# Patient Record
Sex: Male | Born: 1975 | Race: White | Hispanic: No | Marital: Single | Smoking: Current every day smoker
Health system: Southern US, Community
[De-identification: ages and names within clinical notes are randomized; demographics above are authoritative.]

## PROBLEM LIST (undated history)

## (undated) DIAGNOSIS — F419 Anxiety disorder, unspecified: Secondary | ICD-10-CM

## (undated) DIAGNOSIS — N200 Calculus of kidney: Secondary | ICD-10-CM

## (undated) DIAGNOSIS — Z87442 Personal history of urinary calculi: Secondary | ICD-10-CM

## (undated) DIAGNOSIS — N390 Urinary tract infection, site not specified: Secondary | ICD-10-CM

## (undated) HISTORY — PX: NO PAST SURGERIES: SHX2092

## (undated) HISTORY — PX: WISDOM TOOTH EXTRACTION: SHX21

---

## 1999-06-16 ENCOUNTER — Encounter: Payer: Self-pay | Admitting: *Deleted

## 1999-06-16 ENCOUNTER — Ambulatory Visit (HOSPITAL_COMMUNITY): Admission: RE | Admit: 1999-06-16 | Discharge: 1999-06-16 | Payer: Self-pay | Admitting: Pediatrics

## 2010-09-09 ENCOUNTER — Emergency Department (HOSPITAL_COMMUNITY): Admission: EM | Admit: 2010-09-09 | Discharge: 2010-09-09 | Payer: Self-pay | Admitting: Emergency Medicine

## 2010-10-06 ENCOUNTER — Encounter
Admission: RE | Admit: 2010-10-06 | Discharge: 2010-10-15 | Payer: Self-pay | Source: Home / Self Care | Attending: *Deleted | Admitting: *Deleted

## 2010-10-17 ENCOUNTER — Ambulatory Visit (HOSPITAL_COMMUNITY): Admission: RE | Admit: 2010-10-17 | Discharge: 2010-10-17 | Payer: Self-pay | Admitting: *Deleted

## 2012-10-21 ENCOUNTER — Emergency Department (HOSPITAL_COMMUNITY): Payer: Self-pay

## 2012-10-21 ENCOUNTER — Encounter (HOSPITAL_COMMUNITY): Payer: Self-pay | Admitting: Emergency Medicine

## 2012-10-21 ENCOUNTER — Emergency Department (HOSPITAL_COMMUNITY)
Admission: EM | Admit: 2012-10-21 | Discharge: 2012-10-21 | Disposition: A | Discharge status: Home / Self Care | Payer: Self-pay | Attending: Emergency Medicine | Admitting: Emergency Medicine

## 2012-10-21 DIAGNOSIS — F17200 Nicotine dependence, unspecified, uncomplicated: Secondary | ICD-10-CM | POA: Insufficient documentation

## 2012-10-21 DIAGNOSIS — Y9389 Activity, other specified: Secondary | ICD-10-CM | POA: Insufficient documentation

## 2012-10-21 DIAGNOSIS — IMO0002 Reserved for concepts with insufficient information to code with codable children: Secondary | ICD-10-CM | POA: Insufficient documentation

## 2012-10-21 DIAGNOSIS — F411 Generalized anxiety disorder: Secondary | ICD-10-CM | POA: Insufficient documentation

## 2012-10-21 DIAGNOSIS — Z23 Encounter for immunization: Secondary | ICD-10-CM | POA: Insufficient documentation

## 2012-10-21 DIAGNOSIS — S8390XA Sprain of unspecified site of unspecified knee, initial encounter: Secondary | ICD-10-CM

## 2012-10-21 HISTORY — DX: Anxiety disorder, unspecified: F41.9

## 2012-10-21 MED ORDER — OXYCODONE-ACETAMINOPHEN 5-325 MG PO TABS: ORAL_TABLET | ORAL | Status: DC

## 2012-10-21 MED ORDER — OXYCODONE-ACETAMINOPHEN 5-325 MG PO TABS
ORAL_TABLET | ORAL | Status: AC
  Filled 2012-10-21: qty 1

## 2012-10-21 MED ORDER — TETANUS-DIPHTH-ACELL PERTUSSIS 5-2.5-18.5 LF-MCG/0.5 IM SUSP
0.5000 mL | Freq: Once | INTRAMUSCULAR | Status: AC
  Administered 2012-10-21: 0.5 mL via INTRAMUSCULAR
  Filled 2012-10-21: qty 0.5

## 2012-10-21 MED ORDER — OXYCODONE-ACETAMINOPHEN 5-325 MG PO TABS
2.0000 | ORAL_TABLET | Freq: Once | ORAL | Status: AC
  Administered 2012-10-21: 2 via ORAL
  Filled 2012-10-21: qty 2
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
## 2012-10-21 NOTE — ED Provider Notes (Signed)
Medical screening examination/treatment/procedure(s) were performed by non-physician practitioner and as supervising physician I was immediately available for consultation/collaboration.  Derwood Kaplan, MD 10/21/12 2336

## 2012-10-21 NOTE — ED Notes (Signed)
Patient transported to X-ray 

## 2012-10-21 NOTE — ED Notes (Signed)
Patient given discharge instructions, information, prescriptions, and diet order. Patient states that they adequately understand discharge information given and to return to ED if symptoms return or worsen.     
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
## 2014-02-08 ENCOUNTER — Emergency Department (HOSPITAL_COMMUNITY): Payer: No Typology Code available for payment source

## 2014-02-08 ENCOUNTER — Encounter (HOSPITAL_COMMUNITY): Payer: Self-pay | Admitting: Emergency Medicine

## 2014-02-08 DIAGNOSIS — Z7982 Long term (current) use of aspirin: Secondary | ICD-10-CM | POA: Insufficient documentation

## 2014-02-08 DIAGNOSIS — R05 Cough: Secondary | ICD-10-CM | POA: Insufficient documentation

## 2014-02-08 DIAGNOSIS — F411 Generalized anxiety disorder: Secondary | ICD-10-CM | POA: Insufficient documentation

## 2014-02-08 DIAGNOSIS — F17200 Nicotine dependence, unspecified, uncomplicated: Secondary | ICD-10-CM | POA: Insufficient documentation

## 2014-02-08 DIAGNOSIS — R059 Cough, unspecified: Secondary | ICD-10-CM | POA: Insufficient documentation

## 2014-02-08 DIAGNOSIS — Z79899 Other long term (current) drug therapy: Secondary | ICD-10-CM | POA: Insufficient documentation

## 2014-02-08 DIAGNOSIS — R071 Chest pain on breathing: Secondary | ICD-10-CM | POA: Insufficient documentation

## 2014-02-08 DIAGNOSIS — R0602 Shortness of breath: Secondary | ICD-10-CM | POA: Insufficient documentation

## 2014-02-08 LAB — BASIC METABOLIC PANEL
BUN: 12 mg/dL (ref 6–23)
CALCIUM: 9.8 mg/dL (ref 8.4–10.5)
CHLORIDE: 103 meq/L (ref 96–112)
CO2: 26 mEq/L (ref 19–32)
CREATININE: 0.99 mg/dL (ref 0.50–1.35)
Glucose, Bld: 93 mg/dL (ref 70–99)
Potassium: 4.1 mEq/L (ref 3.7–5.3)
Sodium: 145 mEq/L (ref 137–147)

## 2014-02-08 LAB — CBC
HCT: 48.7 % (ref 39.0–52.0)
Hemoglobin: 17.8 g/dL — ABNORMAL HIGH (ref 13.0–17.0)
MCH: 32.2 pg (ref 26.0–34.0)
MCHC: 36.6 g/dL — AB (ref 30.0–36.0)
MCV: 88.1 fL (ref 78.0–100.0)
PLATELETS: 289 10*3/uL (ref 150–400)
RBC: 5.53 MIL/uL (ref 4.22–5.81)
RDW: 14.3 % (ref 11.5–15.5)
WBC: 9.8 10*3/uL (ref 4.0–10.5)

## 2014-02-08 LAB — POCT I-STAT TROPONIN I: TROPONIN I, POC: 0 ng/mL (ref 0.00–0.08)

## 2014-02-08 NOTE — ED Notes (Signed)
Pt informed to let RN in front know if chest pain worsened or he had increased difficulty breathing; process explained that he would get blood work and xray as he waited.

## 2014-02-08 NOTE — ED Notes (Signed)
Pt reports general feeling of unwellness today; chest pain starting at 5:00 pm that was unrelieved with aspirin; radiated to left arm and shoulder. Pain is burning and stabbing "feels like someone sticking hot needle in chest". No cardicac hx. Feels SOB even with no exertion.

## 2014-02-09 ENCOUNTER — Emergency Department (HOSPITAL_COMMUNITY)
Admission: EM | Admit: 2014-02-09 | Discharge: 2014-02-09 | Disposition: A | Discharge status: Home / Self Care | Payer: No Typology Code available for payment source | Attending: Emergency Medicine | Admitting: Emergency Medicine

## 2014-02-09 DIAGNOSIS — R0789 Other chest pain: Secondary | ICD-10-CM

## 2014-02-09 MED ORDER — METAXALONE 800 MG PO TABS: 800.0000 mg | ORAL_TABLET | Freq: Three times a day (TID) | ORAL | Status: DC

## 2014-02-09 MED ORDER — ALBUTEROL SULFATE (2.5 MG/3ML) 0.083% IN NEBU
2.5000 mg | INHALATION_SOLUTION | Freq: Once | RESPIRATORY_TRACT | Status: AC
  Administered 2014-02-09: 2.5 mg via RESPIRATORY_TRACT
  Filled 2014-02-09: qty 3

## 2014-02-09 MED ORDER — KETOROLAC TROMETHAMINE 30 MG/ML IJ SOLN
30.0000 mg | Freq: Once | INTRAMUSCULAR | Status: AC
  Administered 2014-02-09: 30 mg via INTRAMUSCULAR
  Filled 2014-02-09: qty 1

## 2014-02-09 MED ORDER — METAXALONE 800 MG PO TABS
800.0000 mg | ORAL_TABLET | Freq: Once | ORAL | Status: AC
  Administered 2014-02-09: 800 mg via ORAL
  Filled 2014-02-09: qty 1

## 2014-02-09 MED ORDER — IBUPROFEN 600 MG PO TABS: 600.0000 mg | ORAL_TABLET | Freq: Four times a day (QID) | ORAL | Status: DC | PRN

## 2014-02-09 NOTE — ED Provider Notes (Signed)
Medical screening examination/treatment/procedure(s) were performed by non-physician practitioner and as supervising physician I was immediately available for consultation/collaboration.    [name], MD 02/09/14 2323 

## 2014-02-09 NOTE — Progress Notes (Signed)
                  Dear _________[name]_______________:  Bonita QuinYou have been approved to have your discharge prescriptions filled through our [facility] ([facility]ication [facility]) program. This program allows for a one-time (no refills) 34-day supply of selected medications for a low copay amount.  The copay is $3.00 per prescription. For instance, if you have one prescription, you will pay $3.00; for two prescriptions, you pay $6.00; for three prescriptions, you pay $9.00; and so on.  Only certain pharmacies are participating in this program with [facility]. You will need to select one of the pharmacies from the attached list and take your prescriptions, this letter, and your photo ID to one of the participating pharmacies.   We are excited that you are able to use the [facility] program to get your medications. These prescriptions must be filled within 7 days of hospital discharge or they will no longer be valid for the [facility] program. Should you have any problems with your prescriptions please contact your case management team member at [phone].  Thank you,   [facility]   Participating [facility] Pharmacies  [facility]   [facility] [address] [place], [place]   [facility] [address] [place], [place]   [facility] [address] [place], [place]   [facility]   [address], [place], [place]   [id number], [place], [place]   [address], [place], [place]   [id number], [place], [place]   [id number], [place], [place]   [id number] [address], [place], [place]   [address], [place], [place]   [address], [place], [place]   [address], [place], [place]   [id number] [place], [place], [place]  [facility]   [address], [place], [place]   [id number] [place], [place]   [id number], [place], [place]   [id number], [place], [place]   [address], [place], [place]   [id number] [place] [address], [place], [place] [facility]   [address], [place], [place]   [address], [place], [place]   [id number], [place], [place]   [address], [place], [place]   [address], [place], [place]   [address], [place], [place]   [address], [place], [place]   [address], [place], [place]   [id number] [address], [place], [place]   [address], [place], [place]   [address], [place], [place]   [address], [place], [place]   [address], [place], [place]   [id number] [place], [place], [place]  Independent Pharmacies   [facility] [address], [address] [place], [place]   [facility] [address] [place], [place]   [facility] [address] [place], [place]   For continued medication needs, please contact the [facility] at  [phone].

## 2014-02-09 NOTE — ED Provider Notes (Signed)
CSN: [id number]     Arrival date & time 02/08/14  2140 History   First MD Initiated Contact with Patient 02/09/14 0034     Chief Complaint  Patient presents with  . Chest Pain     (Consider location/radiation/quality/duration/timing/severity/associated sxs/prior Treatment) HPI Comments: Patient states, that for the past several, weeks.  He's had this uncomfortable feeling in his chest, sometimes, as a sharp, stabbing feeling, sometimes, a burning sensation, sometimes, as a sticking sensation, associated with feeling short of breath.  He, states several weeks, ago, was intermittent, every couple days, lasting 10-15 minutes.  The episodes have become more frequent and tonight.  He became concerned. He has no personal history of cardiac disease.  Has a remote history of, asthma as a child, and 19.  Has not needed to use any albuterol treatments in quite some time.  Denies any trauma, heavy lifting, recent URI symptoms, but he does have a chronic cough, as he is a smoker, one pack a day, for many years. 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 He has not worked in several weeks.  Today he played disc golf, which made his chest uncomfortable, so he stopped. He does, state that several years ago.  He did have some anxiety issues that felt similar to his current discomfort  Patient is a 38 y.o. male presenting with chest pain. The history is provided by the patient.  Chest Pain Pain location:  L chest Pain quality: aching   Pain radiates to:  Does not radiate Pain radiates to the back: no   Pain severity:  Mild Onset quality:  Unable to specify Duration:  3 weeks Timing:  Intermittent Progression:  Worsening Chronicity:  New Context: breathing and lifting   Relieved by:  None tried Worsened by:  Certain positions, coughing, exertion and movement Ineffective treatments:  None tried Associated symptoms: cough and shortness of breath   Associated  symptoms: no abdominal pain, no back pain, no dizziness, no dysphagia, no fever, no headache, no lower extremity edema, no nausea and no palpitations   Cough:    Cough characteristics:  Non-productive   Onset quality:  Unable to specify   Timing:  Intermittent   Progression:  Unchanged   Chronicity:  Chronic Risk factors: male sex and smoking   Risk factors: no hypertension and not obese     Past Medical History  Diagnosis Date  . Anxiety   . Anxiety    No past surgical history on file. No family history on file. History  Substance Use Topics  . Smoking status: Current Every Day Smoker -- 1.00 packs/day    Types: Cigarettes  . Smokeless tobacco: Not on file  . Alcohol Use: Not on file    Review of Systems  Constitutional: Negative for fever.  HENT: Negative for rhinorrhea and trouble swallowing.   Respiratory: Positive for cough and shortness of breath.   Cardiovascular: Positive for chest pain. Negative for palpitations.  Gastrointestinal: Negative for nausea and abdominal pain.  Musculoskeletal: Negative for back pain.  Neurological: Negative for dizziness and headaches.  All other systems reviewed and are negative.      Allergies  Review of patient's allergies indicates no known allergies.  Home Medications   Current Outpatient Rx  Name  Route  Sig  Dispense  Refill  . aspirin EC 81 MG tablet   Oral   Take 162 mg by mouth daily as needed (pain).         .Marland Kitchen  ibuprofen (ADVIL,MOTRIN) 200 MG tablet   Oral   Take 200 mg by mouth daily as needed for headache. Pain         . ibuprofen (ADVIL,MOTRIN) 600 MG tablet   Oral   Take 1 tablet (600 mg total) by mouth every 6 (six) hours as needed.   30 tablet   0   . metaxalone (SKELAXIN) 800 MG tablet   Oral   Take 1 tablet (800 mg total) by mouth 3 (three) times daily.   60 tablet   0    BP 98/73  Pulse 61  Temp(Src) 97.6 F (36.4 C) (Oral)  Resp 15  SpO2 96% Physical Exam  Nursing note and vitals  reviewed. Constitutional: He is oriented to person, place, and time. He appears well-developed and well-nourished.  HENT:  Head: Normocephalic.  Eyes: Pupils are equal, round, and reactive to light.  Neck: Normal range of motion.  Cardiovascular: Normal rate and regular rhythm.   Pulmonary/Chest: Effort normal and breath sounds normal. No respiratory distress. He has no wheezes. He exhibits tenderness.  Abdominal: Soft.  Musculoskeletal: Normal range of motion. He exhibits no edema and no tenderness.  Neurological: He is alert and oriented to person, place, and time.  Skin: Skin is warm.    ED Course  Procedures (including critical care time) Labs Review Labs Reviewed  CBC - Abnormal; Notable for the following:    Hemoglobin 17.8 (*)    MCHC 36.6 (*)    All other components within normal limits  BASIC METABOLIC PANEL  POCT I-STAT TROPONIN I   Imaging Review Dg Chest 2 View  02/08/2014   CLINICAL DATA:  Left chest pain, cough  EXAM: CHEST  2 VIEW  COMPARISON:  None.  FINDINGS: The heart size and mediastinal contours are within normal limits. There is no focal infiltrate, pulmonary edema, or pleural effusion. Tiny calcified granulomas are identified within the left upper lobe. The visualized skeletal structures are unremarkable.  IMPRESSION: No active cardiopulmonary disease.   Electronically Signed   By: Sherian Rein M.D.   On: 02/08/2014 22:12    EKG Interpretation    Date/Time:  Thursday February 08 2014 21:42:24 EST Ventricular Rate:  91 PR Interval:  128 QRS Duration: 96 QT Interval:  372 QTC Calculation: 457 R Axis:   78 Text Interpretation:  Normal sinus rhythm Nonspecific T wave abnormality Abnormal ECG No old tracing to compare Confirmed by OTTER  MD, OLGA ([id number]) on 02/08/2014 11:46:24 PM            MDM   Final diagnoses:  Anterior chest wall pain         Arman Filter, NP 02/09/14 1958

## 2014-02-09 NOTE — Discharge Instructions (Signed)
Chest Wall Pain Chest wall pain is pain in or around the bones and muscles of your chest. It may take up to 6 weeks to get better. It may take longer if you must stay physically active in your work and activities.  CAUSES  Chest wall pain may happen on its own. However, it may be caused by:  A viral illness like the flu.  Injury.  Coughing.  Exercise.  Arthritis.  Fibromyalgia.  Shingles. HOME CARE INSTRUCTIONS   Avoid overtiring physical activity. Try not to strain or perform activities that cause pain. This includes any activities using your chest or your abdominal and side muscles, especially if heavy weights are used.  Put ice on the sore area.  Put ice in a plastic bag.  Place a towel between your skin and the bag.  Leave the ice on for 15-20 minutes per hour while awake for the first 2 days.  Only take over-the-counter or prescription medicines for pain, discomfort, or fever as directed by your caregiver. SEEK IMMEDIATE MEDICAL CARE IF:   Your pain increases, or you are very uncomfortable.  You have a fever.  Your chest pain becomes worse.  You have new, unexplained symptoms.  You have nausea or vomiting.  You feel sweaty or lightheaded.  You have a cough with phlegm (sputum), or you cough up blood. MAKE SURE YOU:   Understand these instructions.  Will watch your condition.  Will get help right away if you are not doing well or get worse. Document Released: 12/14/2005 Document Revised: 03/07/2012 Document Reviewed: 08/10/2011 [facility] Patient Information 2014 [place], [place].  Emergency Department Resource Guide 1) Find a Doctor and Pay Out of Pocket Although you won't have to find out who is covered by your insurance plan, it is a good idea to ask around and get recommendations. You will then need to call the office and see if the doctor you have chosen will accept you as a new patient and what types of options they offer for patients who are self-pay.  Some doctors offer discounts or will set up payment plans for their patients who do not have insurance, but you will need to ask so you aren't surprised when you get to your appointment.  2) Contact Your Local Health Department Not all health departments have doctors that can see patients for sick visits, but many do, so it is worth a call to see if yours does. If you don't know where your local health department is, you can check in your phone book. The CDC also has a tool to help you locate your state's health department, and many state websites also have listings of all of their local health departments.  3) Find a Walk-in Clinic If your illness is not likely to be very severe or complicated, you may want to try a walk in clinic. These are popping up all over the country in pharmacies, drugstores, and shopping centers. They're usually staffed by nurse practitioners or physician assistants that have been trained to treat common illnesses and complaints. They're usually fairly quick and inexpensive. However, if you have serious medical issues or chronic medical problems, these are probably not your best option.  No Primary Care Doctor: - Call Health Connect at  [phone] - they can help you locate a primary care doctor that  accepts your insurance, provides certain services, etc. - Physician Referral Service- [phone]  Chronic Pain Problems: Organization         Address  Phone  Notes  °[facility]  ([phone]) [phone] Patients need to be referred by their primary care doctor.  ° °Medication Assistance: °Organization         Address  Phone   Notes  °[place] [facility] [address] °[place], [place] [address] (336) 641-8030 --Must be a resident of [place] °-- Must have NO insurance coverage whatsoever (no Medicaid/ Medicare, etc.) °-- The pt. MUST have a primary care doctor that directs their care regularly and follows them in the  community °  °[facility]ist  (866) 331-1348   °United Way  (888) 892-1162   ° °Agencies that provide inexpensive medical care: °Organization         Address  Phone   Notes  °[facility]  (336) [phone]   °[facility]    (336) 832-7272   °[facility] [address] °[place], [place] [address] (336) 832-4777   °[facility] [address], °[place] (336) 271-4999   °[facility]    (336) 373-0678   °[facility]    (336) 272-1050   °[facility] ° [address], [place] Phone:  (336) 832-4444, Fax:  (336) 832-4440 Hours of Operation:  9 am - 6 pm, M-F.  Also accepts Medicaid/Medicare and self-pay.  °[facility] ° [address], [place] Phone: (336) [phone], Fax: (336) [phone]. Hours of Operation:  8:30 am - 5:30 pm, M-F.  Also accepts Medicaid and self-pay.  °[facility] [place] [address], [place] Phone: (336) [phone]   °[facility] [address], [place], [place] (336)723-1848, Ext. 123 Mondays & Thursdays: 7-9 AM.  First 15 patients are seen on a first come, first serve basis. °  ° °Medicaid-accepting [place] Providers: ° °Organization         Address  Phone   Notes  °[facility] [address], [place] ([phone]) [phone] Also accepts self-pay patients.  °[facility] [address], [place] ° (336) 856-9996   °[facility] [address], [place] (336) 288-8857   °[facility] [address] [place] [address], [place] (336) 299-7000   °Veita Bland [address], [place]  ° (336) [phone] Only accepts [place] Access Medicaid patients after they have their name applied to their card.  ° °Self-Pay (no insurance) in [place]: ° °Organization         Address  Phone   Notes  °[facility], [facility] [address], [place] (336) [phone]   °[facility] [address], [place] (336) 832-4400   °[facility] [place] ° [address], [place] (336) 992-4800   °[facility]/Dr. Osei-Bonsu ° [address] [place] [address], [place] or [address], [place] (336) [phone] Phone number for both [place] and [place] locations is the same.  °[facility] [address], [place] (336) [phone]   °[facility] [address] [place] [address],  or [address] (336) 852-7530 °(336) 878-2260   °[facility] [address],  (336) 350-1642, phone; (336) [phone], fax Sees patients 1st and 3rd Saturday of every month.  Must not qualify for public or private insurance (  i.e. Medicaid, Medicare, [place] Health Choice, Veterans' Benefits)  Household income should be no more than 200% of the poverty level The clinic cannot treat you if you are pregnant or think you are pregnant  Sexually transmitted diseases are not treated at the clinic.    Dental Care: Organization         Address  Phone  Notes  [facility] [address] [place], [place] [phone] Accepts children up to age 18 who are enrolled in [place] or [place] Health Choice; pregnant women with a Medicaid card; and children who have applied for Medicaid or [place] Health Choice, but were declined, whose parents can pay a reduced fee at time of service.  [facility] of [facility]  [address], [place] ([phone] Accepts children up to age 43 who are enrolled in [place] or [place] Health Choice; pregnant women with a Medicaid card; and children who have applied for Medicaid or [place] Health Choice, but were declined, whose parents can pay a reduced fee at time of service.  [facility]  [address] [place], [place] [phone] Patients are seen by appointment only. Walk-ins are not accepted. [facility] will see patients 39 years of age and older. Monday - Tuesday (8am-5pm) Most Wednesdays (8:30-5pm) $30 per visit, cash only  [facility]  [address], [facility] [phone] Patients are seen by appointment only. Walk-ins are not accepted. [facility] will see patients 31 years of age and older. One Wednesday Evening (Monthly: Volunteer Based).  $30 per visit, cash only  [employer]  [phone] for adults; Children under age 36, call Graduate Pediatric Dentistry at [phone]. Children aged 35-14, please call [phone] to request a pediatric application.  Dental services are provided in all areas of dental care including fillings, crowns and bridges, complete and partial dentures, implants, gum treatment, root canals, and extractions. Preventive care is also provided. Treatment is provided to both adults and children. Patients are selected via a lottery and there is often a waiting list.   [facility] [address], [place]  [phone] [email or web address]   [facility] [address] [place], [place] [phone], Ext. 123 Second and Fourth Thursday of each month, opens at 6:30 AM; Clinic ends at 9 AM.  Patients are seen on a first-come first-served basis, and a limited number are seen during each clinic.   [facility]  [address] [place]ins [place], [place] [phone]   Eligibility Requirements You must have lived in [place], [place], or [place] counties for at least the last three months.   You cannot be eligible for state or federal sponsored [place], including [employer], [place], or [employer].   You generally cannot be eligible for healthcare insurance through your employer.    How to apply: Eligibility screenings are held every  Tuesday and Wednesday afternoon from 1:00 pm until 4:00 pm. You do not need an appointment for the interview!  [facility] [address] [place], [place] [phone]   [facility]  [phone]   [facility]  [phone]   [facility]  [phone]    Behavioral Health Resources in the Community: Intensive Outpatient Programs Organization         Address  Phone  Notes  [facility] [address], [place], [place] [phone]   [facility] [address], [place], [place] [phone]   [facility] [address], [place], [place]  [phone]   [facility] [facility] [address],  [place], [place] [phone] or [phone]   Substance Abuse Resources Organization         Address  Phone  Notes  [facility]  [phone]   [facility]  ([phone]) [phone]   [facility]  [phone]   [facility]  [phone]   Residential & Outpatient Substance Abuse Program  [phone]   Psychological Services Organization         Address  Phone  Notes  [facility]  [phone]- [phone]   [facility]  [phone]- [phone]   [facility] [address], [place] [phone] or [phone]    Mobile Crisis Teams Organization         Address  Phone  Notes  [facility], Mobile Crisis Care Unit  [phone]   [facility]  [address] [place], [place] [phone]   Doristine LocksSharon DeEsch [address] [place] [phone]    Self-Help/Support Groups Organization         Address  Phone             Notes  [facility] [place] - variety of support groups  336- [phone] Call for more information  Narcotics Anonymous (NA), [facility] [address], [place]  2 meetings at this location    Statisticianesidential Treatment Programs Organization         Address  Phone  Notes  [facility] [address],    [place]  [phone]   [facility]  [address], [place] [phone], [place], [place] [phone]   [facility] [address], [place] [phone] Admissions: 8am-3pm M-F  [facility] [address]    [place], [place] [phone]   [facility] [address], [place], [place] [phone]   [facility] [address]  [place], [place] [phone]   [facility] - Intensive Outpatient [address], [place], [place] [phone]   [facility] ([facility] [address]  [place], [place] [phone] or [phone]   [facility] ([facility]) [address] [place], [place] [phone] Accepts Medicaid  [facility] [address]  [place] [phone] Substance Abuse/Addiction Treatment   [facility] Behavioral Health Resources Organization         Address  Phone  Notes  [facility]  [phone]) [phone]   Angie FavaJulie Brannon, PhD [address], [address] [place], [place]   [phone] or [phone](336) [phone]   [facility]   [address] [place], [place] [phone]   [facility] [address], [place], [place] [phone] Insurance/Medicaid/sponsorship through [facility]  [facility] [address]                                    [place], [place] [phone] Therapy/tele-psych/case  [facility] [address]   [place], [place] [phone]) [phone]    Dr. Lolly MustacheArfeen  [phone]   [facility]  [employer] [facility] 1)  [address], [place] 2) [address] [place] 3)  [address], [place] [phone]  [phone]   [facility] Child Abuse Hotline 8703698094 or [phone] (After  Hours)      Today, your evaluation is negative for pneumonia, cardiac causes.  You have been given a muscle relaxer, and anti-inflammatory, which significantly relieved your  pain.  Please take this on regular, basis, as, directed.  You've also been given a Pharmacist, hospital.  Please use this to obtain a primary care physician again warning about smoking.  Please try to stop

## 2014-02-09 NOTE — Progress Notes (Signed)
   CARE MANAGEMENT ED NOTE 02/09/2014  Patient:  Jared Serrano,Jared   Account Number:  [id number]  Date Initiated:  02/09/2014  Documentation initiated by:  Edd ArbourGIBBS,Qadir Folks  Subjective/Objective Assessment:   38 yr old self pay [place] resident [facility] c/o chest pain with order for skelaxin but can not afford  no pcp listed     Subjective/Objective Assessment Detail:     Action/Plan:   ED CM received a call from pt's mother stating pt prescribed a muscle relaxant but can not afford medicine   Action/Plan Detail:   Anticipated DC Date:  02/09/2014     Status Recommendation to Physician:   Result of Recommendation:    Other ED Services  Consult Working Plan    DC Planning Services  Other  PCP issues  Medication Assistance  MATCH Program    Choice offered to / List presented to:            Status of service:  Completed, signed off  ED Comments:   ED Comments Detail:  02/09/14 1658 Left pt a voice message requesting he [facility] his [facility] (listed as preferred pharmacy of pt in EPIC) to obtain his medication. Review [facility] MATCH program ($3 co pay for each Rx through [facility], does not include refills, 7 day expiration of MATCH letter and choice of pharmacies) Encouraged to contact CM if further questions 1657 confirmed with [facility] that [facility] received 1645  Pt is eligible for [facility] MATCH program (unable to find pt listed in PDMI per cardholder name inquiry) PDMI information entered. MATCH letter completed and faxed to 336 [phone] with fax confirmation received

## 2016-04-16 ENCOUNTER — Emergency Department (HOSPITAL_COMMUNITY): Payer: Self-pay

## 2016-04-16 ENCOUNTER — Encounter (HOSPITAL_COMMUNITY): Payer: Self-pay

## 2016-04-16 ENCOUNTER — Emergency Department (HOSPITAL_COMMUNITY)
Admission: EM | Admit: 2016-04-16 | Discharge: 2016-04-16 | Disposition: A | Discharge status: Home / Self Care | Payer: Self-pay | Attending: Emergency Medicine | Admitting: Emergency Medicine

## 2016-04-16 DIAGNOSIS — R109 Unspecified abdominal pain: Secondary | ICD-10-CM | POA: Insufficient documentation

## 2016-04-16 DIAGNOSIS — R319 Hematuria, unspecified: Secondary | ICD-10-CM | POA: Insufficient documentation

## 2016-04-16 DIAGNOSIS — Z8659 Personal history of other mental and behavioral disorders: Secondary | ICD-10-CM | POA: Insufficient documentation

## 2016-04-16 DIAGNOSIS — F17210 Nicotine dependence, cigarettes, uncomplicated: Secondary | ICD-10-CM | POA: Insufficient documentation

## 2016-04-16 DIAGNOSIS — Z87442 Personal history of urinary calculi: Secondary | ICD-10-CM | POA: Insufficient documentation

## 2016-04-16 DIAGNOSIS — Z87440 Personal history of urinary (tract) infections: Secondary | ICD-10-CM | POA: Insufficient documentation

## 2016-04-16 HISTORY — DX: Calculus of kidney: N20.0

## 2016-04-16 HISTORY — DX: Urinary tract infection, site not specified: N39.0

## 2016-04-16 LAB — URINALYSIS, ROUTINE W REFLEX MICROSCOPIC
GLUCOSE, UA: NEGATIVE mg/dL
Ketones, ur: NEGATIVE mg/dL
Leukocytes, UA: NEGATIVE
Nitrite: NEGATIVE
PROTEIN: 100 mg/dL — AB
Specific Gravity, Urine: 1.035 — ABNORMAL HIGH (ref 1.005–1.030)
pH: 6 (ref 5.0–8.0)

## 2016-04-16 LAB — I-STAT CHEM 8, ED
BUN: 15 mg/dL (ref 6–20)
Calcium, Ion: 0.99 mmol/L — ABNORMAL LOW (ref 1.12–1.23)
Chloride: 101 mmol/L (ref 101–111)
Creatinine, Ser: 0.8 mg/dL (ref 0.61–1.24)
Glucose, Bld: 107 mg/dL — ABNORMAL HIGH (ref 65–99)
HCT: 51 % (ref 39.0–52.0)
Hemoglobin: 17.3 g/dL — ABNORMAL HIGH (ref 13.0–17.0)
Potassium: 4.7 mmol/L (ref 3.5–5.1)
Sodium: 137 mmol/L (ref 135–145)
TCO2: 27 mmol/L (ref 0–100)

## 2016-04-16 LAB — URINE MICROSCOPIC-ADD ON

## 2016-04-16 MED ORDER — SODIUM CHLORIDE 0.9 % IV BOLUS (SEPSIS)
1000.0000 mL | Freq: Once | INTRAVENOUS | Status: AC
  Administered 2016-04-16: 1000 mL via INTRAVENOUS

## 2016-04-16 MED ORDER — KETOROLAC TROMETHAMINE 30 MG/ML IJ SOLN
30.0000 mg | Freq: Once | INTRAMUSCULAR | Status: AC
  Administered 2016-04-16: 30 mg via INTRAVENOUS
  Filled 2016-04-16: qty 1

## 2016-04-16 MED ORDER — OXYCODONE-ACETAMINOPHEN 5-325 MG PO TABS: 1.0000 | ORAL_TABLET | Freq: Four times a day (QID) | ORAL | Status: DC | PRN

## 2016-04-16 MED ORDER — HYDROMORPHONE HCL 1 MG/ML IJ SOLN
1.0000 mg | Freq: Once | INTRAMUSCULAR | Status: AC
  Administered 2016-04-16: 1 mg via INTRAVENOUS
  Filled 2016-04-16: qty 1

## 2016-04-16 NOTE — ED Provider Notes (Signed)
CSN: [id number]     Arrival date & time 04/16/16  1829 History   First MD Initiated Contact with Patient 04/16/16 2025     Chief Complaint  Patient presents with  . Flank Pain  . Hematuria     (Consider location/radiation/quality/duration/timing/severity/associated sxs/prior Treatment) HPI Patient presents to the emergency department with flank pain that started earlier today.  The patient states that he has had a history of kidney stones.  The patient states that he had some nausea but no vomiting.  Patient did take any medications prior to arrival.  The patient denies chest pain, shortness of breath, headache,blurred vision, neck pain, fever, cough, weakness, numbness, dizziness, anorexia, edema, vomiting, diarrhea, rash, back pain, dysuria, hematemesis, bloody stool, near syncope, or syncope. Past Medical History  Diagnosis Date  . Anxiety   . Anxiety   . Kidney stone   . UTI (lower urinary tract infection)    History reviewed. No pertinent past surgical history. Family History  Problem Relation Age of Onset  . Diabetes Mother   . Neuropathy Mother   . Urolithiasis Father    Social History  Substance Use Topics  . Smoking status: Current Every Day Smoker -- 1.00 packs/day    Types: Cigarettes  . Smokeless tobacco: Never Used  . Alcohol Use: Yes     Comment: ocassinally    Review of Systems All other systems negative except as documented in the HPI. All pertinent positives and negatives as reviewed in the HPI.    Allergies  Review of patient's allergies indicates no known allergies.  Home Medications   Prior to Admission medications   Medication Sig Start Date End Date Taking? Authorizing Provider  acetaminophen (TYLENOL) 325 MG tablet Take 650 mg by mouth every 6 (six) hours as needed for moderate pain.   Yes Historical Provider, MD  ibuprofen (ADVIL,MOTRIN) 600 MG tablet Take 1 tablet (600 mg total) by mouth every 6 (six) hours as needed. Patient not taking:  Reported on 04/16/2016 02/09/14   Earley Favor, NP  metaxalone (SKELAXIN) 800 MG tablet Take 1 tablet (800 mg total) by mouth 3 (three) times daily. Patient not taking: Reported on 04/16/2016 02/09/14   Earley Favor, NP   BP 132/70 mmHg  Pulse 52  Temp(Src) 97.4 F (36.3 C) (Oral)  Resp 16  Ht 6' (1.829 m)  Wt 72.576 kg  BMI 21.70 kg/m2  SpO2 99% Physical Exam  Constitutional: He is oriented to person, place, and time. He appears well-developed and well-nourished. No distress.  HENT:  Head: Normocephalic and atraumatic.  Mouth/Throat: Oropharynx is clear and moist.  Eyes: Pupils are equal, round, and reactive to light.  Neck: Normal range of motion. Neck supple.  Cardiovascular: Normal rate, regular rhythm and normal heart sounds.  Exam reveals no gallop and no friction rub.   No murmur heard. Pulmonary/Chest: Effort normal and breath sounds normal. No respiratory distress. He has no wheezes.  Abdominal: Soft. Bowel sounds are normal. He exhibits no distension. There is tenderness. There is no rebound and no guarding.  Neurological: He is alert and oriented to person, place, and time. He exhibits normal muscle tone. Coordination normal.  Skin: Skin is warm and dry. No rash noted. No erythema.  Psychiatric: He has a normal mood and affect. His behavior is normal.  Nursing note and vitals reviewed.   ED Course  Procedures (including critical care time) Labs Review Labs Reviewed  URINALYSIS, ROUTINE W REFLEX MICROSCOPIC (NOT AT [facility]) - Abnormal; Notable for the  following:    Color, Urine AMBER (*)    APPearance CLOUDY (*)    Specific Gravity, Urine 1.035 (*)    Hgb urine dipstick LARGE (*)    Bilirubin Urine SMALL (*)    Protein, ur 100 (*)    All other components within normal limits  URINE MICROSCOPIC-ADD ON - Abnormal; Notable for the following:    Squamous Epithelial / LPF 0-5 (*)    Bacteria, UA RARE (*)    All other components within normal limits  I-STAT CHEM 8, ED -  Abnormal; Notable for the following:    Glucose, Bld 107 (*)    Calcium, Ion 0.99 (*)    Hemoglobin 17.3 (*)    All other components within normal limits    Imaging Review Ct Renal Stone Study  04/16/2016  CLINICAL DATA:  Left flank pain and hematuria. History of renal calculi EXAM: CT ABDOMEN AND PELVIS WITHOUT CONTRAST TECHNIQUE: Multidetector CT imaging of the abdomen and pelvis was performed following the standard protocol without IV contrast. COMPARISON:  None. FINDINGS: Lower chest: Dependent subsegmental atelectasis in both lower lobes and medially in the right middle lobe. Hepatobiliary: Unremarkable Pancreas: Unremarkable Spleen: Unremarkable Adrenals/Urinary Tract: 1.2 by 0.9 by 0.9 cm nonobstructive calculus in the left mid kidney collecting system, image 82 series 5. No hydronephrosis or hydroureter. No additional stones. Urinary bladder nondistended but otherwise unremarkable. Stomach/Bowel: Borderline wall thickening in several loops of proximal jejunum. Otherwise unremarkable. Appendix normal. Vascular/Lymphatic: Mild aortoiliac atherosclerotic calcification. Reproductive: Punctate calcifications centrally in the prostate gland, image 81/2. Other: No supplemental non-categorized findings. Musculoskeletal: Mild spurring of both femoral heads. IMPRESSION: 1. 1.2 cm nonobstructive calculus in the left mid kidney collecting system. 2. There is borderline wall thickening in several loops of proximal jejunum. Proximal enteritis not excluded. No surrounding mesenteric edema. 3. Mild aortoiliac atherosclerosis. Electronically Signed   By: Gaylyn RongWalter  Liebkemann M.D.   On: 04/16/2016 21:35   I have personally reviewed and evaluated these images and lab results as part of my medical decision-making.   EKG Interpretation None      MDM   Final diagnoses:  Flank pain      Patient be treated for ureteral colic.  Told to follow up with urology.  Patient is advised return here as needed.  The  patient is feeling better following pain medications and IV fluids  Charlestine NightChristopher Kessler Kopinski, PA-C 04/17/16 [id number]  Lyndal Pulleyaniel Knott, MD 04/17/16 [phone]

## 2016-04-16 NOTE — Discharge Instructions (Signed)
Return here as needed.  Follow up with the urologist provided.  Increase your fluid intake °

## 2016-04-16 NOTE — ED Notes (Signed)
Patient c/o left flank pain and hematuria. Patient has a history of kidney stone and UTI.

## 2018-09-05 ENCOUNTER — Observation Stay (HOSPITAL_COMMUNITY)
Admission: EM | Admit: 2018-09-05 | Discharge: 2018-09-05 | Disposition: A | Discharge status: Home / Self Care | Payer: Self-pay | Attending: Urology | Admitting: Urology

## 2018-09-05 ENCOUNTER — Other Ambulatory Visit: Payer: Self-pay

## 2018-09-05 ENCOUNTER — Encounter (HOSPITAL_COMMUNITY): Payer: Self-pay

## 2018-09-05 ENCOUNTER — Emergency Department (HOSPITAL_COMMUNITY): Payer: Self-pay

## 2018-09-05 DIAGNOSIS — Z791 Long term (current) use of non-steroidal anti-inflammatories (NSAID): Secondary | ICD-10-CM | POA: Insufficient documentation

## 2018-09-05 DIAGNOSIS — Z87442 Personal history of urinary calculi: Secondary | ICD-10-CM | POA: Insufficient documentation

## 2018-09-05 DIAGNOSIS — F17210 Nicotine dependence, cigarettes, uncomplicated: Secondary | ICD-10-CM | POA: Insufficient documentation

## 2018-09-05 DIAGNOSIS — D72829 Elevated white blood cell count, unspecified: Secondary | ICD-10-CM | POA: Insufficient documentation

## 2018-09-05 DIAGNOSIS — N200 Calculus of kidney: Secondary | ICD-10-CM

## 2018-09-05 DIAGNOSIS — Z79899 Other long term (current) drug therapy: Secondary | ICD-10-CM | POA: Insufficient documentation

## 2018-09-05 DIAGNOSIS — N132 Hydronephrosis with renal and ureteral calculous obstruction: Principal | ICD-10-CM | POA: Insufficient documentation

## 2018-09-05 DIAGNOSIS — Z23 Encounter for immunization: Secondary | ICD-10-CM | POA: Insufficient documentation

## 2018-09-05 LAB — CBC WITH DIFFERENTIAL/PLATELET
BASOS ABS: 0 10*3/uL (ref 0.0–0.1)
BASOS PCT: 0 %
EOS PCT: 2 %
Eosinophils Absolute: 0.2 10*3/uL (ref 0.0–0.7)
HEMATOCRIT: 47 % (ref 39.0–52.0)
Hemoglobin: 16.4 g/dL (ref 13.0–17.0)
Lymphocytes Relative: 12 %
Lymphs Abs: 1.3 10*3/uL (ref 0.7–4.0)
MCH: 32.2 pg (ref 26.0–34.0)
MCHC: 34.9 g/dL (ref 30.0–36.0)
MCV: 92.3 fL (ref 78.0–100.0)
MONO ABS: 0.7 10*3/uL (ref 0.1–1.0)
MONOS PCT: 6 %
NEUTROS ABS: 8.6 10*3/uL — AB (ref 1.7–7.7)
Neutrophils Relative %: 80 %
PLATELETS: 257 10*3/uL (ref 150–400)
RBC: 5.09 MIL/uL (ref 4.22–5.81)
RDW: 14.3 % (ref 11.5–15.5)
WBC: 10.8 10*3/uL — ABNORMAL HIGH (ref 4.0–10.5)

## 2018-09-05 LAB — URINALYSIS, ROUTINE W REFLEX MICROSCOPIC
BILIRUBIN URINE: NEGATIVE
Glucose, UA: NEGATIVE mg/dL
Ketones, ur: NEGATIVE mg/dL
Nitrite: NEGATIVE
Protein, ur: NEGATIVE mg/dL
SPECIFIC GRAVITY, URINE: 1.011 (ref 1.005–1.030)
pH: 6 (ref 5.0–8.0)

## 2018-09-05 LAB — BASIC METABOLIC PANEL
Anion gap: 8 (ref 5–15)
BUN: 18 mg/dL (ref 6–20)
CALCIUM: 9.3 mg/dL (ref 8.9–10.3)
CO2: 28 mmol/L (ref 22–32)
CREATININE: 1.04 mg/dL (ref 0.61–1.24)
Chloride: 105 mmol/L (ref 98–111)
GFR calc Af Amer: >60 mL/min (ref >60)
GLUCOSE: 108 mg/dL — AB (ref 70–99)
Potassium: 4.1 mmol/L (ref 3.5–5.1)
Sodium: 141 mmol/L (ref 135–145)

## 2018-09-05 MED ORDER — CEPHALEXIN 500 MG PO CAPS: 500.0000 mg | ORAL_CAPSULE | Freq: Four times a day (QID) | ORAL | 0 refills | Status: AC

## 2018-09-05 MED ORDER — HYDROMORPHONE HCL 1 MG/ML IJ SOLN: 0.5000 mg | INTRAMUSCULAR | Status: DC | PRN

## 2018-09-05 MED ORDER — INFLUENZA VAC SPLIT QUAD 0.5 ML IM SUSY
0.5000 mL | PREFILLED_SYRINGE | INTRAMUSCULAR | Status: AC
  Administered 2018-09-05: 0.5 mL via INTRAMUSCULAR
  Filled 2018-09-05: qty 0.5

## 2018-09-05 MED ORDER — OXYCODONE-ACETAMINOPHEN 5-325 MG PO TABS: 1.0000 | ORAL_TABLET | Freq: Four times a day (QID) | ORAL | 0 refills | Status: DC | PRN

## 2018-09-05 MED ORDER — ONDANSETRON HCL 4 MG/2ML IJ SOLN: 4.0000 mg | INTRAMUSCULAR | Status: DC | PRN

## 2018-09-05 MED ORDER — FENTANYL CITRATE (PF) 100 MCG/2ML IJ SOLN
50.0000 ug | Freq: Once | INTRAMUSCULAR | Status: AC
  Administered 2018-09-05: 50 ug via INTRAVENOUS
  Filled 2018-09-05: qty 2

## 2018-09-05 MED ORDER — ACETAMINOPHEN 325 MG PO TABS: 650.0000 mg | ORAL_TABLET | ORAL | Status: DC | PRN

## 2018-09-05 MED ORDER — POTASSIUM CHLORIDE IN NACL 20-0.45 MEQ/L-% IV SOLN
INTRAVENOUS | Status: DC
  Administered 2018-09-05: 15:00:00 via INTRAVENOUS
  Filled 2018-09-05: qty 1000

## 2018-09-05 MED ORDER — LACTATED RINGERS IV BOLUS
1000.0000 mL | Freq: Once | INTRAVENOUS | Status: AC
  Administered 2018-09-05: 1000 mL via INTRAVENOUS

## 2018-09-05 MED ORDER — ZOLPIDEM TARTRATE 5 MG PO TABS: 5.0000 mg | ORAL_TABLET | Freq: Every evening | ORAL | Status: DC | PRN

## 2018-09-05 MED ORDER — HYDROMORPHONE HCL 1 MG/ML IJ SOLN
1.0000 mg | Freq: Once | INTRAMUSCULAR | Status: AC
  Administered 2018-09-05: 1 mg via INTRAVENOUS
  Filled 2018-09-05: qty 1

## 2018-09-05 MED ORDER — HYDROCODONE-ACETAMINOPHEN 5-325 MG PO TABS: 1.0000 | ORAL_TABLET | ORAL | Status: DC | PRN

## 2018-09-05 MED ORDER — INFLUENZA VAC SPLIT QUAD 0.5 ML IM SUSY: 0.5000 mL | PREFILLED_SYRINGE | INTRAMUSCULAR | Status: DC

## 2018-09-05 MED ORDER — ONDANSETRON HCL 4 MG/2ML IJ SOLN: 4.0000 mg | Freq: Three times a day (TID) | INTRAMUSCULAR | Status: DC | PRN

## 2018-09-05 NOTE — ED Notes (Signed)
Made Dr Lockie Mola aware of pt request for more pain medications.

## 2018-09-05 NOTE — Discharge Instructions (Signed)
Percutaneous Nephrolithotomy Percutaneous nephrolithotomy is a procedure to remove kidney stones. Kidney stones are deposits that form inside your kidneys and can cause pain. You may need this procedure if:  You have large kidney stones. Kidney stones that are bigger than 2 cm (0.78 in) wide may require this procedure.  Your kidney stones are oddly shaped.  Other treatments have not been successful in helping the kidney stones to pass.  You have developed an infection due to the kidney stones.  Tell a health care provider about:  Any allergies you have.  All medicines you are taking, including vitamins, herbs, eye drops, creams, and over-the-counter medicines.  Any problems you or family members have had with anesthetic medicines.  Any blood disorders you have.  Any surgeries you have had.  Any medical conditions you have.  Whether you are pregnant or may be pregnant.  Whether you use any tobacco products, including cigarettes, chewing tobacco, or e-cigarettes. What are the risks? Generally, this is a safe procedure. However, problems may occur, including:  Infection.  Bleeding. This may include blood in your urine.  Allergic reactions to medicines.  Damage to other structures or organs.  Kidney damage.  Holes in the kidney. These often heal on their own.  Numbness or tingling in the affected area.  Sometimes, not all of the kidney stones are able to be removed with this procedure, so you may need a different procedure to remove them. What happens before the procedure?  Follow instructions from your health care provider about eating or drinking restrictions.  Ask your health care provider about: ? Changing or stopping your regular medicines. This is especially important if you are taking diabetes medicines or blood thinners. ? Taking medicines such as aspirin and ibuprofen. These medicines can thin your blood. Do not take these medicines before your procedure if  your health care provider instructs you not to.  Plan to have someone take you home after the procedure.  If you go home right after the procedure, plan to have someone with you for 24 hours.  You may have tests, including: ? Blood tests. ? Urine tests. ? Tests to check how your heart is working.  Ask your health care provider how your surgical site will be marked or identified.  You may be given antibiotic medicine to help prevent infection. What happens during the procedure?  To reduce your risk of infection: ? Your health care team will wash or sanitize their hands. ? Your skin will be washed with soap.  An IV tube will be inserted into one of your veins.  You will be given one or more of the following: ? A medicine to help you relax (sedative). ? A medicine to numb the area (local anesthetic). ? A medicine to make you fall asleep (general anesthetic). ? A medicine that is injected into your spine to numb the area below and slightly above the injection site (spinal anesthetic). ? A medicine that is injected into an area of your body to numb everything below the injection site (regional anesthetic).  A thin tube (catheter) will be put in your bladder to drain urine during and after the procedure.  Your surgeon will make a small cut (incision) in your lower back.  A tube will be inserted through the incision into your kidney.  Each kidney stone will be removed through this tube. Larger stones may need to be broken up with a high-intensity light beam (laser) or other tools.  If a kidney   stone left the kidney, your surgeon will bring it back to the kidney and then remove it through the tube.  After all of the stones have been removed, a kidney drain tube will be put in. This will help to drain any fluid that builds up while your kidney heals.  Part of the incision may be closed with stitches (sutures).  A bandage (dressing) will be placed over the incision area. The  procedure may vary among health care providers and hospitals. What happens after the procedure?  Your blood pressure, heart rate, breathing rate, and blood oxygen level will be monitored often until the medicines you were given have worn off.  You may be given medicine for pain.  You will be encouraged to walk. Walking helps to prevent blood clots.  You may be taught breathing exercises.  Do not drive for 24 hours if you received a sedative. This information is not intended to replace advice given to you by your health care provider. Make sure you discuss any questions you have with your health care provider. Document Released: 10/11/2009 Document Revised: 05/21/2016 Document Reviewed: 06/10/2015 Elsevier Interactive Patient Education  2018 [employer]  

## 2018-09-05 NOTE — H&P (Signed)
Subjective: CC: Left flank pain.  Hx: Jared Serrano is a 42 yo WM who I was asked to see in consultation by Dr. Si Gaul for 1.5cm left renal pelvic stone with pain.   Patient presented to [facility] ED on 09/05/18 with acute left flank pain x 24 hours. Also difficulty with urination. Pain progressed overnight, severe this AM. No N/V. No other symptoms. No LUTS.   CT renal colic demonstrated a 1.5 cm obstructing left UPJ stone with mild proximal hydronephrosis. No other ureteral stones.   UA 09/05/18: small LE, neg nitrites, 11-20 WBC, 6-10 RBC, rare bacteria WBC 10.8 Cr 1.04  Received pain meds in ED. Currently pain is minimal and he is very comfortable. He is inquiring about discharge to home if surgery isn't performed tonight.   +personal history of nephrolithiasis. All spontaneously passed. No urologic procedures. Last stone event was several years ago. +family hx of stones in father. He denies any other medical problems. No past surgeries. No meds. Allergic to poison ivy.    ROS:  ROS  No Known Allergies  Past Medical History:  Diagnosis Date  . Anxiety   . Anxiety   . Kidney stone   . UTI (lower urinary tract infection)     History reviewed. No pertinent surgical history.  Social History   Socioeconomic History  . Marital status: Married    Spouse name: Not on file  . Number of children: Not on file  . Years of education: Not on file  . Highest education level: Not on file  Occupational History  . Not on file  Social Needs  . Financial resource strain: Not on file  . Food insecurity:    Worry: Not on file    Inability: Not on file  . Transportation needs:    Medical: Not on file    Non-medical: Not on file  Tobacco Use  . Smoking status: Current Every Day Smoker    Packs/day: 1.00    Types: Cigarettes  . Smokeless tobacco: Never Used  Substance and Sexual Activity  . Alcohol use: Yes    Comment: ocassinally  . Drug use: Yes    Types: Marijuana    Comment:  occasionally  . Sexual activity: Not on file  Lifestyle  . Physical activity:    Days per week: Not on file    Minutes per session: Not on file  . Stress: Not on file  Relationships  . Social connections:    Talks on phone: Not on file    Gets together: Not on file    Attends religious service: Not on file    Active member of club or organization: Not on file    Attends meetings of clubs or organizations: Not on file    Relationship status: Not on file  . Intimate partner violence:    Fear of current or ex partner: Not on file    Emotionally abused: Not on file    Physically abused: Not on file    Forced sexual activity: Not on file  Other Topics Concern  . Not on file  Social History Narrative  . Not on file    Family History  Problem Relation Age of Onset  . Diabetes Mother   . Neuropathy Mother   . Urolithiasis Father     Anti-infectives: Anti-infectives (From admission, onward)   None      Current Facility-Administered Medications  Medication Dose Route Frequency Provider Last Rate Last Dose  . 0.45 % NaCl with KCl  20 mEq / L infusion   Intravenous Continuous [name], MD      . acetaminophen (TYLENOL) tablet 650 mg  650 mg Oral Q4H PRN [name], MD      . HYDROcodone-acetaminophen (NORCO/VICODIN) 5-325 MG per tablet 1-2 tablet  1-2 tablet Oral Q4H PRN [name], MD      . HYDROmorphone (DILAUDID) injection 0.5-1 mg  0.5-1 mg Intravenous Q2H PRN [name], MD      . Melene Muller ON 09/06/2018] Influenza vac split quadrivalent PF (FLUARIX) injection 0.5 mL  0.5 mL Intramuscular Tomorrow-1000 [name], MD      . ondansetron [facility]) injection 4 mg  4 mg Intravenous Q4H PRN [name], MD      . zolpidem (AMBIEN) tablet 5 mg  5 mg Oral QHS PRN,MR X 1 [name], MD         Objective: Vital signs in last 24 hours: Temp:  [97.5 F (36.4 C)-98.7 F (37.1 C)] 98.7 F (37.1 C) (09/09 1355) Pulse Rate:  [60-98] 98 (09/09 1355) Resp:  [16-17] 16 (09/09  1355) BP: (135-155)/(82-94) 155/88 (09/09 1355) SpO2:  [98 %-100 %] 98 % (09/09 1355) Weight:  [71.6 kg-74.8 kg] 71.6 kg (09/09 1355)  Intake/Output from previous day: No intake/output data recorded. Intake/Output this shift: Total I/O In: 1000 [IV Piggyback:1000] Out: -    Physical Exam  Gen: pleasant, calm, no acute distress Pulm: normal work of breathing Cv: normal heart rate Abd: minimal left flank tenderness. Belly soft, nondistended Extrem: warm to touch, pink   Lab Results:  Recent Labs    09/05/18 1108  WBC 10.8*  HGB 16.4  HCT 47.0  PLT 257   BMET Recent Labs    09/05/18 1108  NA 141  K 4.1  CL 105  CO2 28  GLUCOSE 108*  BUN 18  CREATININE 1.04  CALCIUM 9.3   PT/INR No results for input(s): LABPROT, INR in the last 72 hours. ABG No results for input(s): PHART, HCO3 in the last 72 hours.  Invalid input(s): PCO2, PO2  Studies/Results: Ct Renal Stone Study  Result Date: 09/05/2018 CLINICAL DATA:  42 year old male with intermittent left flank pain since last night and unable to urinate. History of stones. Subsequent encounter. EXAM: CT ABDOMEN AND PELVIS WITHOUT CONTRAST TECHNIQUE: Multidetector CT imaging of the abdomen and pelvis was performed following the standard protocol without IV contrast. COMPARISON:  04/16/2016 CT. FINDINGS: Lower chest: Scarring/atelectasis lung bases. Heart size within normal limits. Hepatobiliary: Enlarged liver spanning over 20.6 cm. Taking into account limitation by non contrast imaging, no worrisome hepatic lesion. No calcified gallstone. Pancreas: Taking into account limitation by non contrast imaging, no worrisome pancreatic mass or inflammation. Spleen: Taking into account limitation by non contrast imaging, no splenic mass or enlargement. Adrenals/Urinary Tract: 1.5 cm obstructing stone left ureteral pelvic junction with moderate left-sided hydronephrosis. Left upper pole scattered tiny nonobstructing renal calculi  measuring up to 2.5 mm. Right upper pole tiny nonobstructing renal calculi. Slightly lobulated contour of the kidneys. Taking into account limitation by non contrast imaging, no obvious renal mass. Noncontrast filled views of the urinary bladder without abnormality noted. No adrenal lesion. Stomach/Bowel: No extraluminal bowel inflammatory process. Vascular/Lymphatic: Trace calcified plaque aortic bifurcation and common iliac arteries. No abdominal aortic aneurysm. Scattered normal size lymph nodes. Reproductive: Top-normal size prostate gland with left paracentral coarse calcification. Other: No free air or bowel containing hernia. Musculoskeletal: No osseous destructive lesion. IMPRESSION: 1. 1.5 cm obstructing stone left ureteral pelvic junction  with moderate left-sided hydronephrosis. Left upper pole scattered tiny nonobstructing renal calculi measuring up to 2.5 mm. Right upper pole tiny nonobstructing renal calculi. 2.  Aortic Atherosclerosis (ICD10-I70.0). 3. Top-normal size prostate gland. 4. Elongated liver spanning over 20.6 cm. Electronically Signed   By: Lacy Duverney M.D.   On: 09/05/2018 11:30     Assessment: 42 year old otherwise healthy male with a 1.5 cm obstructing LEFT UPJ stone. No evidence of infection. Currently low level of pain following dilaudid in ED  Discussed r/b of retrograde ureteral stenting with ESWL or URS vs. Left PCN placement as stone is unlikely to spontaneously pass.  I believe PCNL is the best option with his stone density and will schedule that at a later date.   - admission to urology, floor - PO norco, IV dilaudid breakthrough - IV fluids, NPO for OR - follow up urine culture     CC: Dr. Patrina Levering 09/05/2018 [phone]

## 2018-09-05 NOTE — Discharge Summary (Signed)
Physician Discharge Summary  Patient ID: Jared Serrano MRN: [id number] DOB/AGE: [birth year] 41 y.o.  Admit date: 09/05/2018 Discharge date: 09/05/2018  Admission Diagnoses:  Left nephrolithiasis  Discharge Diagnoses:  Principal Problem:   Left nephrolithiasis Active Problems:   Kidney stone   Past Medical History:  Diagnosis Date  . Anxiety   . Anxiety   . Kidney stone   . UTI (lower urinary tract infection)     Surgeries:  on * No surgery found *   Consultants (if any): Treatment Team:  [name], MD  Discharged Condition: Improved  Hospital Course: Jared Serrano is an 42 y.o. male who was admitted 09/05/2018 with a diagnosis of Left nephrolithiasis with a 1.5cm left renal pelvic stone with pain.  He was admitted to observation for pain control but has not required med since he was in the ER.  He is requesting discharge home.  His UA had 6-10 RBC's and 11-20 WBC's.  He has a mild leukocytosis but no fever.   A culture was obtained.  The stone was present in the same position in 2017 but it has grown.  The stone has a density of 1500HU.   He will be best served by PCNL to remove the stone because of the long standing location in the renal pelvis and the high density which suggests a stone that is likely to be more resistant to ESWL fragmentation.   He was given perioperative antibiotics:  Anti-infectives (From admission, onward)   Start     Dose/Rate Route Frequency Ordered Stop   09/05/18 0000  cephALEXin (KEFLEX) 500 MG capsule     500 mg Oral 4 times daily 09/05/18 1642 09/15/18 2359    .  He was given early ambulation DVT prophylaxis.  He benefited maximally from the hospital stay and there were no complications.    Recent vital signs:  Vitals:   09/05/18 1347 09/05/18 1355  BP: 139/82 (!) 155/88  Pulse: 60 98  Resp: 17 16  Temp:  98.7 F (37.1 C)  SpO2: 99% 98%    Recent laboratory studies:  Lab Results  Component Value Date   HGB 16.4 09/05/2018   HGB 17.3 (H)  04/16/2016   HGB 17.8 (H) 02/08/2014   Lab Results  Component Value Date   WBC 10.8 (H) 09/05/2018   PLT 257 09/05/2018   No results found for: INR Lab Results  Component Value Date   NA 141 09/05/2018   K 4.1 09/05/2018   CL 105 09/05/2018   CO2 28 09/05/2018   BUN 18 09/05/2018   CREATININE 1.04 09/05/2018   GLUCOSE 108 (H) 09/05/2018    Discharge Medications:    Keflex, Percocet.    Diagnostic Studies: Ct Renal Stone Study  Result Date: 09/05/2018 CLINICAL DATA:  42 year old male with intermittent left flank pain since last night and unable to urinate. History of stones. Subsequent encounter. EXAM: CT ABDOMEN AND PELVIS WITHOUT CONTRAST TECHNIQUE: Multidetector CT imaging of the abdomen and pelvis was performed following the standard protocol without IV contrast. COMPARISON:  04/16/2016 CT. FINDINGS: Lower chest: Scarring/atelectasis lung bases. Heart size within normal limits. Hepatobiliary: Enlarged liver spanning over 20.6 cm. Taking into account limitation by non contrast imaging, no worrisome hepatic lesion. No calcified gallstone. Pancreas: Taking into account limitation by non contrast imaging, no worrisome pancreatic mass or inflammation. Spleen: Taking into account limitation by non contrast imaging, no splenic mass or enlargement. Adrenals/Urinary Tract: 1.5 cm obstructing stone left ureteral pelvic junction with moderate left-sided  hydronephrosis. Left upper pole scattered tiny nonobstructing renal calculi measuring up to 2.5 mm. Right upper pole tiny nonobstructing renal calculi. Slightly lobulated contour of the kidneys. Taking into account limitation by non contrast imaging, no obvious renal mass. Noncontrast filled views of the urinary bladder without abnormality noted. No adrenal lesion. Stomach/Bowel: No extraluminal bowel inflammatory process. Vascular/Lymphatic: Trace calcified plaque aortic bifurcation and common iliac arteries. No abdominal aortic aneurysm. Scattered  normal size lymph nodes. Reproductive: Top-normal size prostate gland with left paracentral coarse calcification. Other: No free air or bowel containing hernia. Musculoskeletal: No osseous destructive lesion. IMPRESSION: 1. 1.5 cm obstructing stone left ureteral pelvic junction with moderate left-sided hydronephrosis. Left upper pole scattered tiny nonobstructing renal calculi measuring up to 2.5 mm. Right upper pole tiny nonobstructing renal calculi. 2.  Aortic Atherosclerosis (ICD10-I70.0). 3. Top-normal size prostate gland. 4. Elongated liver spanning over 20.6 cm. Electronically Signed   By: Lacy Duverney M.D.   On: 09/05/2018 11:30    Disposition: Discharge disposition: 01-Home or Self Care         Follow-up Information    [name], MD.   Specialty:  Urology Why:  I will have the office call about setting up surgery.  Contact information: [address] [place] [address] [phone]            Signed: [name] 09/05/2018, 4:43 PM

## 2018-09-05 NOTE — ED Provider Notes (Signed)
[facility]-EMERGENCY DEPT Provider Note   CSN: [id number] Arrival date & time: 09/05/18  1028     History   Chief Complaint Chief Complaint  Patient presents with  . Flank Pain    HPI Jared Serrano is a 42 y.o. male.  The history is provided by the patient.  Abdominal Pain   This is a new problem. The current episode started 6 to 12 hours ago. The problem occurs constantly. The problem has not changed since onset.Associated with: Left flank pain, concerned for kidney stone, similar pain in past.  The pain is located in the LUQ. The quality of the pain is sharp and dull. The pain is at a severity of 7/10. The pain is moderate. Pertinent negatives include anorexia, fever, belching, flatus, melena, vomiting, constipation, dysuria, hematuria, arthralgias and myalgias. Nothing aggravates the symptoms. Nothing relieves the symptoms. Past workup does not include surgery. His past medical history does not include gallstones.    Past Medical History:  Diagnosis Date  . Anxiety   . Anxiety   . Kidney stone   . UTI (lower urinary tract infection)     Patient Active Problem List   Diagnosis Date Noted  . Left nephrolithiasis 09/05/2018  . Kidney stone 09/05/2018    History reviewed. No pertinent surgical history.      Home Medications    Prior to Admission medications   Medication Sig Start Date End Date Taking? Authorizing Provider  acetaminophen (TYLENOL) 325 MG tablet Take 650 mg by mouth every 6 (six) hours as needed for moderate pain.   Yes [provider]  ibuprofen (ADVIL,MOTRIN) 200 MG tablet Take 200 mg by mouth daily as needed (pain).   Yes [provider]  cephALEXin (KEFLEX) 500 MG capsule Take 1 capsule (500 mg total) by mouth 4 (four) times daily for 10 days. 09/05/18 09/15/18  [name], MD  oxyCODONE-acetaminophen (PERCOCET/ROXICET) 5-325 MG tablet Take 1 tablet by mouth every 6 (six) hours as needed for severe pain. 09/05/18    [name], MD    Family History Family History  Problem Relation Age of Onset  . Diabetes Mother   . Neuropathy Mother   . Urolithiasis Father     Social History Social History   Tobacco Use  . Smoking status: Current Every Day Smoker    Packs/day: 1.00    Types: Cigarettes  . Smokeless tobacco: Never Used  Substance Use Topics  . Alcohol use: Yes    Comment: ocassinally  . Drug use: Yes    Types: Marijuana    Comment: occasionally     Allergies   Patient has no known allergies.   Review of Systems Review of Systems  Constitutional: Negative for chills and fever.  HENT: Negative for ear pain and sore throat.   Eyes: Negative for pain and visual disturbance.  Respiratory: Negative for cough and shortness of breath.   Cardiovascular: Negative for chest pain and palpitations.  Gastrointestinal: Positive for abdominal pain. Negative for anorexia, constipation, flatus, melena and vomiting.  Genitourinary: Negative for dysuria and hematuria.  Musculoskeletal: Negative for arthralgias, back pain and myalgias.  Skin: Negative for color change and rash.  Neurological: Negative for seizures and syncope.  All other systems reviewed and are negative.    Physical Exam Updated Vital Signs  ED Triage Vitals  Enc Vitals Group     BP 09/05/18 1033 (!) 135/94     Pulse Rate 09/05/18 1033 72     Resp 09/05/18 1033  17     Temp 09/05/18 1033 (!) 97.5 F (36.4 C)     Temp Source 09/05/18 1033 Oral     SpO2 09/05/18 1033 100 %     Weight 09/05/18 1044 165 lb (74.8 kg)     Height 09/05/18 1044 5\' 10"  (1.778 m)     Head Circumference --      Peak Flow --      Pain Score 09/05/18 1043 7     Pain Loc --      Pain Edu? --      Excl. in GC? --     Physical Exam  Constitutional: He is oriented to person, place, and time. He appears well-developed and well-nourished.  HENT:  Head: Normocephalic and atraumatic.  Eyes: Pupils are equal, round, and reactive to light.  Conjunctivae and EOM are normal.  Neck: Normal range of motion. Neck supple.  Cardiovascular: Normal rate, regular rhythm, normal heart sounds and intact distal pulses.  No murmur heard. Pulmonary/Chest: Effort normal and breath sounds normal. No respiratory distress.  Abdominal: Soft. He exhibits no distension. There is no tenderness.  Musculoskeletal: Normal range of motion. He exhibits tenderness (TTP in left cva). He exhibits no edema.  Neurological: He is alert and oriented to person, place, and time.  Skin: Skin is warm and dry. Capillary refill takes less than 2 seconds.  Psychiatric: He has a normal mood and affect.  Nursing note and vitals reviewed.    ED Treatments / Results  Labs (all labs ordered are listed, but only abnormal results are displayed) Labs Reviewed  URINALYSIS, ROUTINE W REFLEX MICROSCOPIC - Abnormal; Notable for the following components:      Result Value   Hgb urine dipstick MODERATE (*)    Leukocytes, UA SMALL (*)    Bacteria, UA RARE (*)    All other components within normal limits  CBC WITH DIFFERENTIAL/PLATELET - Abnormal; Notable for the following components:   WBC 10.8 (*)    Neutro Abs 8.6 (*)    All other components within normal limits  BASIC METABOLIC PANEL - Abnormal; Notable for the following components:   Glucose, Bld 108 (*)    All other components within normal limits  URINE CULTURE  HIV ANTIBODY (ROUTINE TESTING)    EKG None  Radiology Ct Renal Stone Study  Result Date: 09/05/2018 CLINICAL DATA:  42 year old male with intermittent left flank pain since last night and unable to urinate. History of stones. Subsequent encounter. EXAM: CT ABDOMEN AND PELVIS WITHOUT CONTRAST TECHNIQUE: Multidetector CT imaging of the abdomen and pelvis was performed following the standard protocol without IV contrast. COMPARISON:  04/16/2016 CT. FINDINGS: Lower chest: Scarring/atelectasis lung bases. Heart size within normal limits. Hepatobiliary:  Enlarged liver spanning over 20.6 cm. Taking into account limitation by non contrast imaging, no worrisome hepatic lesion. No calcified gallstone. Pancreas: Taking into account limitation by non contrast imaging, no worrisome pancreatic mass or inflammation. Spleen: Taking into account limitation by non contrast imaging, no splenic mass or enlargement. Adrenals/Urinary Tract: 1.5 cm obstructing stone left ureteral pelvic junction with moderate left-sided hydronephrosis. Left upper pole scattered tiny nonobstructing renal calculi measuring up to 2.5 mm. Right upper pole tiny nonobstructing renal calculi. Slightly lobulated contour of the kidneys. Taking into account limitation by non contrast imaging, no obvious renal mass. Noncontrast filled views of the urinary bladder without abnormality noted. No adrenal lesion. Stomach/Bowel: No extraluminal bowel inflammatory process. Vascular/Lymphatic: Trace calcified plaque aortic bifurcation and common iliac arteries. No abdominal  aortic aneurysm. Scattered normal size lymph nodes. Reproductive: Top-normal size prostate gland with left paracentral coarse calcification. Other: No free air or bowel containing hernia. Musculoskeletal: No osseous destructive lesion. IMPRESSION: 1. 1.5 cm obstructing stone left ureteral pelvic junction with moderate left-sided hydronephrosis. Left upper pole scattered tiny nonobstructing renal calculi measuring up to 2.5 mm. Right upper pole tiny nonobstructing renal calculi. 2.  Aortic Atherosclerosis (ICD10-I70.0). 3. Top-normal size prostate gland. 4. Elongated liver spanning over 20.6 cm. Electronically Signed   By: Lacy Duverney M.D.   On: 09/05/2018 11:30    Procedures Procedures (including critical care time)  Medications Ordered in ED Medications  lactated ringers bolus 1,000 mL (0 mLs Intravenous Stopped 09/05/18 1219)  fentaNYL (SUBLIMAZE) injection 50 mcg (50 mcg Intravenous Given 09/05/18 1117)  HYDROmorphone (DILAUDID)  injection 1 mg (1 mg Intravenous Given 09/05/18 1234)  Influenza vac split quadrivalent PF (FLUARIX) injection 0.5 mL (0.5 mLs Intramuscular Given 09/05/18 1644)     Initial Impression / Assessment and Plan / ED Course  I have reviewed the triage vital signs and the nursing notes.  Pertinent labs & imaging results that were available during my care of the patient were reviewed by me and considered in my medical decision making (see chart for details).     Tymell Gladstone is a 42 year old male with history of kidney stones who presents to the ED with left-sided flank pain.  Patient with normal vitals.  No fever.  Flank pain for the last several hours that woke him up from his sleep.  Patient has history of kidney stones and pain feels similar.  Denies any urinary symptoms.  No hematuria.  No fever, chills.  Patient is overall well-appearing but has some signs of distress.  He is tender in the left CVA area.  No abdominal wall tenderness.  Concern for kidney stone versus urinary tract infection.  Lab work showed no significant anemia, electrolyte abnormality, kidney injury.  CT scan showed 1.5 cm kidney stone with some left-sided hydronephrosis.  Dr. Annabell Howells with urology was consulted and recommends observation for pain control and possible intervention on kidney stone given size.  Patient with some leukocytes on urinalysis but overall no acute sign of infection.  Will defer antibiotic treatment to urology.  Patient admitted for further pain control and was given IV fentanyl and IV Dilaudid in the ED.  Patient given fluid bolus..  Patient feeling mildly improved after 2 rounds of IV pain medicine and will continue to be treated inpatient.  Hemodynamically stable throughout my care.  Final Clinical Impressions(s) / ED Diagnoses   Final diagnoses:  Kidney stone    ED Discharge Orders         Ordered    oxyCODONE-acetaminophen (PERCOCET/ROXICET) 5-325 MG tablet  Every 6 hours PRN     09/05/18 1616     cephALEXin (KEFLEX) 500 MG capsule  4 times daily     09/05/18 1642           Shalaya Swailes, DO 09/05/18 2004

## 2018-09-05 NOTE — ED Triage Notes (Signed)
Patient c/o intermittent left flank pain since last night and states he was unable to urinate until this AM and when he did it was a small amount. Patient has a history of kidney stones.

## 2018-09-05 NOTE — ED Notes (Signed)
Patient transported to CT 

## 2018-09-06 LAB — HIV ANTIBODY (ROUTINE TESTING W REFLEX): HIV Screen 4th Generation wRfx: NONREACTIVE

## 2018-09-06 LAB — URINE CULTURE: Culture: NO GROWTH

## 2018-09-08 ENCOUNTER — Other Ambulatory Visit: Payer: Self-pay | Admitting: Urology

## 2018-09-08 DIAGNOSIS — N200 Calculus of kidney: Secondary | ICD-10-CM

## 2018-09-19 NOTE — Progress Notes (Signed)
Cbc and bmet 09-05-18 epic

## 2018-09-23 ENCOUNTER — Other Ambulatory Visit: Payer: Self-pay | Admitting: Radiology

## 2018-09-23 ENCOUNTER — Other Ambulatory Visit: Payer: Self-pay

## 2018-09-23 ENCOUNTER — Encounter (HOSPITAL_COMMUNITY)
Admission: RE | Admit: 2018-09-23 | Discharge: 2018-09-23 | Disposition: A | Discharge status: Home / Self Care | Payer: Self-pay | Source: Ambulatory Visit | Attending: Urology | Admitting: Urology

## 2018-09-23 ENCOUNTER — Encounter (HOSPITAL_COMMUNITY): Payer: Self-pay

## 2018-09-23 DIAGNOSIS — Z01818 Encounter for other preprocedural examination: Secondary | ICD-10-CM | POA: Insufficient documentation

## 2018-09-23 DIAGNOSIS — N200 Calculus of kidney: Secondary | ICD-10-CM | POA: Insufficient documentation

## 2018-09-23 HISTORY — DX: Personal history of urinary calculi: Z87.442

## 2018-09-23 NOTE — Patient Instructions (Signed)
Jared Serrano  09/23/2018   Your procedure is scheduled on: 09-27-18  Report to [facility] Main  Entrance  Report to admitting at 830 AM    Call this number if you have problems the morning of surgery [phone]   Remember: Do not eat food or drink liquids :After Midnight. BRUSH YOUR TEETH MORNING OF SURGERY AND RINSE YOUR MOUTH OUT, NO CHEWING GUM CANDY OR MINTS.     Take these medicines the morning of surgery with A SIP OF WATER: tylenol if needed              You may not have any metal on your body including hair pins and              piercings  Do not wear jewelry, make-up, lotions, powders or perfumes, deodorant             Do not wear nail polish.  Do not shave  48 hours prior to surgery.              Men may shave face and neck.   Do not bring valuables to the hospital. [place] IS NOT             RESPONSIBLE   FOR VALUABLES.  Contacts, dentures or bridgework may not be worn into surgery.  Leave suitcase in the car. After surgery it may be brought to your room.                  Please read over the following fact sheets you were given: _____________________________________________________________________             [facility] - Preparing for Surgery Before surgery, you can play an important role.  Because skin is not sterile, your skin needs to be as free of germs as possible.  You can reduce the number of germs on your skin by washing with CHG (chlorahexidine gluconate) soap before surgery.  CHG is an antiseptic cleaner which kills germs and bonds with the skin to continue killing germs even after washing. Please DO NOT use if you have an allergy to CHG or antibacterial soaps.  If your skin becomes reddened/irritated stop using the CHG and inform your nurse when you arrive at Short Stay. Do not shave (including legs and underarms) for at least 48 hours prior to the first CHG shower.  You may shave your face/neck. Please follow these instructions  carefully:  1.  Shower with CHG Soap the night before surgery and the  morning of Surgery.  2.  If you choose to wash your hair, wash your hair first as usual with your  normal  shampoo.  3.  After you shampoo, rinse your hair and body thoroughly to remove the  shampoo.                           4.  Use CHG as you would any other liquid soap.  You can apply chg directly  to the skin and wash                       Gently with a scrungie or clean washcloth.  5.  Apply the CHG Soap to your body ONLY FROM THE NECK DOWN.   Do not use on face/ open  Wound or open sores. Avoid contact with eyes, ears mouth and genitals (private parts).                       Wash face,  Genitals (private parts) with your normal soap.             6.  Wash thoroughly, paying special attention to the area where your surgery  will be performed.  7.  Thoroughly rinse your body with warm water from the neck down.  8.  DO NOT shower/wash with your normal soap after using and rinsing off  the CHG Soap.                9.  Pat yourself dry with a clean towel.            10.  Wear clean pajamas.            11.  Place clean sheets on your bed the night of your first shower and do not  sleep with pets. Day of Surgery : Do not apply any lotions/deodorants the morning of surgery.  Please wear clean clothes to the hospital/surgery center.  FAILURE TO FOLLOW THESE INSTRUCTIONS MAY RESULT IN THE CANCELLATION OF YOUR SURGERY PATIENT SIGNATURE_________________________________  NURSE SIGNATURE__________________________________  ________________________________________________________________________

## 2018-09-26 ENCOUNTER — Inpatient Hospital Stay (HOSPITAL_COMMUNITY)
Admission: EM | Admit: 2018-09-26 | Discharge: 2018-09-29 | DRG: 661 | Disposition: A | Discharge status: Home / Self Care | Payer: Self-pay | Attending: Urology | Admitting: Urology

## 2018-09-26 ENCOUNTER — Other Ambulatory Visit (HOSPITAL_COMMUNITY): Payer: Self-pay | Admitting: Interventional Radiology

## 2018-09-26 ENCOUNTER — Ambulatory Visit (HOSPITAL_COMMUNITY)
Admission: RE | Admit: 2018-09-26 | Discharge: 2018-09-26 | Disposition: A | Discharge status: Home / Self Care | Payer: Self-pay | Source: Ambulatory Visit | Attending: Urology | Admitting: Urology

## 2018-09-26 ENCOUNTER — Encounter (HOSPITAL_COMMUNITY): Payer: Self-pay | Admitting: Family Medicine

## 2018-09-26 ENCOUNTER — Encounter (HOSPITAL_COMMUNITY): Payer: Self-pay

## 2018-09-26 ENCOUNTER — Other Ambulatory Visit: Payer: Self-pay

## 2018-09-26 DIAGNOSIS — Z466 Encounter for fitting and adjustment of urinary device: Secondary | ICD-10-CM | POA: Insufficient documentation

## 2018-09-26 DIAGNOSIS — N200 Calculus of kidney: Secondary | ICD-10-CM | POA: Insufficient documentation

## 2018-09-26 DIAGNOSIS — R109 Unspecified abdominal pain: Secondary | ICD-10-CM

## 2018-09-26 DIAGNOSIS — N132 Hydronephrosis with renal and ureteral calculous obstruction: Principal | ICD-10-CM | POA: Diagnosis present

## 2018-09-26 DIAGNOSIS — F17210 Nicotine dependence, cigarettes, uncomplicated: Secondary | ICD-10-CM | POA: Insufficient documentation

## 2018-09-26 DIAGNOSIS — Z9889 Other specified postprocedural states: Secondary | ICD-10-CM | POA: Insufficient documentation

## 2018-09-26 DIAGNOSIS — Z87442 Personal history of urinary calculi: Secondary | ICD-10-CM

## 2018-09-26 DIAGNOSIS — Z841 Family history of disorders of kidney and ureter: Secondary | ICD-10-CM | POA: Insufficient documentation

## 2018-09-26 DIAGNOSIS — Z833 Family history of diabetes mellitus: Secondary | ICD-10-CM

## 2018-09-26 HISTORY — PX: IR URETERAL STENT RIGHT NEW ACCESS W/SEP NEPHROSTOMY CATH: IMG6078

## 2018-09-26 HISTORY — PX: IR URETERAL STENT LEFT NEW ACCESS W/O SEP NEPHROSTOMY CATH: IMG6075

## 2018-09-26 LAB — CBC WITH DIFFERENTIAL/PLATELET
BASOS ABS: 0.1 10*3/uL (ref 0.0–0.1)
BASOS PCT: 1 %
Eosinophils Absolute: 0.3 10*3/uL (ref 0.0–0.7)
Eosinophils Relative: 4 %
HEMATOCRIT: 48.4 % (ref 39.0–52.0)
HEMOGLOBIN: 17 g/dL (ref 13.0–17.0)
Lymphocytes Relative: 28 %
Lymphs Abs: 2.3 10*3/uL (ref 0.7–4.0)
MCH: 32.4 pg (ref 26.0–34.0)
MCHC: 35.1 g/dL (ref 30.0–36.0)
MCV: 92.4 fL (ref 78.0–100.0)
Monocytes Absolute: 0.5 10*3/uL (ref 0.1–1.0)
Monocytes Relative: 6 %
NEUTROS ABS: 5.1 10*3/uL (ref 1.7–7.7)
NEUTROS PCT: 61 %
Platelets: 302 10*3/uL (ref 150–400)
RBC: 5.24 MIL/uL (ref 4.22–5.81)
RDW: 14.1 % (ref 11.5–15.5)
WBC: 8.3 10*3/uL (ref 4.0–10.5)

## 2018-09-26 LAB — BASIC METABOLIC PANEL
ANION GAP: 11 (ref 5–15)
BUN: 27 mg/dL — ABNORMAL HIGH (ref 6–20)
CALCIUM: 9.4 mg/dL (ref 8.9–10.3)
CO2: 26 mmol/L (ref 22–32)
Chloride: 106 mmol/L (ref 98–111)
Creatinine, Ser: 1.1 mg/dL (ref 0.61–1.24)
Glucose, Bld: 99 mg/dL (ref 70–99)
Potassium: 4.1 mmol/L (ref 3.5–5.1)
Sodium: 143 mmol/L (ref 135–145)

## 2018-09-26 LAB — PROTIME-INR
INR: 0.89
Prothrombin Time: 12 seconds (ref 11.4–15.2)

## 2018-09-26 MED ORDER — FENTANYL CITRATE (PF) 100 MCG/2ML IJ SOLN
INTRAMUSCULAR | Status: AC
  Filled 2018-09-26: qty 2

## 2018-09-26 MED ORDER — SODIUM CHLORIDE 0.9 % IV SOLN
INTRAVENOUS | Status: DC
  Administered 2018-09-26: 09:00:00 via INTRAVENOUS

## 2018-09-26 MED ORDER — KCL IN DEXTROSE-NACL 20-5-0.45 MEQ/L-%-% IV SOLN
INTRAVENOUS | Status: DC
  Administered 2018-09-26 – 2018-09-29 (×6): via INTRAVENOUS
  Filled 2018-09-26 (×7): qty 1000

## 2018-09-26 MED ORDER — HYDROMORPHONE HCL 1 MG/ML IJ SOLN
1.0000 mg | Freq: Once | INTRAMUSCULAR | Status: AC
  Administered 2018-09-26: 1 mg via INTRAVENOUS
  Filled 2018-09-26: qty 1

## 2018-09-26 MED ORDER — LIDOCAINE HCL 1 % IJ SOLN
INTRAMUSCULAR | Status: AC
  Filled 2018-09-26: qty 20

## 2018-09-26 MED ORDER — ACETAMINOPHEN 325 MG PO TABS: 650.0000 mg | ORAL_TABLET | ORAL | Status: DC | PRN

## 2018-09-26 MED ORDER — IOPAMIDOL (ISOVUE-300) INJECTION 61%
50.0000 mL | Freq: Once | INTRAVENOUS | Status: AC | PRN
  Administered 2018-09-26: 15 mL

## 2018-09-26 MED ORDER — FENTANYL CITRATE (PF) 100 MCG/2ML IJ SOLN
INTRAMUSCULAR | Status: AC | PRN
  Administered 2018-09-26 (×3): 50 ug via INTRAVENOUS

## 2018-09-26 MED ORDER — ONDANSETRON HCL 4 MG/2ML IJ SOLN
4.0000 mg | Freq: Once | INTRAMUSCULAR | Status: AC
  Administered 2018-09-26: 4 mg via INTRAVENOUS
  Filled 2018-09-26: qty 2

## 2018-09-26 MED ORDER — FLEET ENEMA 7-19 GM/118ML RE ENEM: 1.0000 | ENEMA | Freq: Once | RECTAL | Status: DC | PRN

## 2018-09-26 MED ORDER — CEFAZOLIN SODIUM-DEXTROSE 2-4 GM/100ML-% IV SOLN
2.0000 g | INTRAVENOUS | Status: AC
  Administered 2018-09-26: 2 g via INTRAVENOUS

## 2018-09-26 MED ORDER — MIDAZOLAM HCL 2 MG/2ML IJ SOLN
INTRAMUSCULAR | Status: AC | PRN
  Administered 2018-09-26 (×5): 1 mg via INTRAVENOUS

## 2018-09-26 MED ORDER — HYDROCODONE-ACETAMINOPHEN 5-325 MG PO TABS
1.0000 | ORAL_TABLET | ORAL | Status: DC | PRN
  Administered 2018-09-26 (×2): 1 via ORAL
  Filled 2018-09-26 (×2): qty 1

## 2018-09-26 MED ORDER — BISACODYL 10 MG RE SUPP: 10.0000 mg | Freq: Every day | RECTAL | Status: DC | PRN

## 2018-09-26 MED ORDER — MIDAZOLAM HCL 2 MG/2ML IJ SOLN
INTRAMUSCULAR | Status: AC
  Filled 2018-09-26: qty 4

## 2018-09-26 MED ORDER — OXYCODONE HCL 5 MG PO TABS
5.0000 mg | ORAL_TABLET | ORAL | Status: DC | PRN
  Administered 2018-09-26 – 2018-09-29 (×8): 5 mg via ORAL
  Filled 2018-09-26 (×8): qty 1

## 2018-09-26 MED ORDER — SENNOSIDES-DOCUSATE SODIUM 8.6-50 MG PO TABS: 1.0000 | ORAL_TABLET | Freq: Every evening | ORAL | Status: DC | PRN

## 2018-09-26 MED ORDER — HYDROMORPHONE HCL 1 MG/ML IJ SOLN
0.5000 mg | INTRAMUSCULAR | Status: DC | PRN
  Administered 2018-09-27 – 2018-09-28 (×7): 1 mg via INTRAVENOUS
  Filled 2018-09-26 (×7): qty 1

## 2018-09-26 MED ORDER — MIDAZOLAM HCL 2 MG/2ML IJ SOLN
INTRAMUSCULAR | Status: AC
  Filled 2018-09-26: qty 2

## 2018-09-26 MED ORDER — HYOSCYAMINE SULFATE 0.125 MG SL SUBL
0.1250 mg | SUBLINGUAL_TABLET | Freq: Once | SUBLINGUAL | Status: AC
  Administered 2018-09-26: 0.125 mg via SUBLINGUAL
  Filled 2018-09-26 (×2): qty 1

## 2018-09-26 MED ORDER — ONDANSETRON HCL 4 MG/2ML IJ SOLN
4.0000 mg | INTRAMUSCULAR | Status: DC | PRN
  Administered 2018-09-27 – 2018-09-28 (×3): 4 mg via INTRAVENOUS
  Filled 2018-09-26 (×3): qty 2

## 2018-09-26 MED ORDER — IOPAMIDOL (ISOVUE-300) INJECTION 61%
INTRAVENOUS | Status: AC
  Administered 2018-09-26: 15 mL
  Filled 2018-09-26: qty 50

## 2018-09-26 MED ORDER — BELLADONNA ALKALOIDS-OPIUM 16.2-60 MG RE SUPP
1.0000 | Freq: Four times a day (QID) | RECTAL | Status: DC | PRN
  Administered 2018-09-28: 1 via RECTAL
  Filled 2018-09-26: qty 1

## 2018-09-26 MED ORDER — CEFAZOLIN SODIUM-DEXTROSE 2-4 GM/100ML-% IV SOLN
INTRAVENOUS | Status: AC
  Administered 2018-09-26: 2 g via INTRAVENOUS
  Filled 2018-09-26: qty 100

## 2018-09-26 NOTE — Consult Note (Signed)
Chief Complaint: Patient was seen in consultation today for left percutaneous nephrostomy/nephroureteral catheter placement  Referring Physician(s): Wrenn,John  Supervising Physician: Oley Balm  Patient Status: [facility] - Out-pt  History of Present Illness: Jared Serrano is a 42 y.o. male smoker with history of left flank pain and 1.5 cm obstructive left UPJ stone with moderate hydronephrosis who presents today for left percutaneous nephrostomy/nephroureteral catheter placement prior to nephrolithotomy.  Past Medical History:  Diagnosis Date  . Anxiety    no recent trouble with  . Anxiety   . History of kidney stones    passed 2 in age 20's or 62's  . Kidney stone   . UTI (lower urinary tract infection)     Past Surgical History:  Procedure Laterality Date  . NO PAST SURGERIES    . WISDOM TOOTH EXTRACTION      Allergies: Patient has no known allergies.  Medications: Prior to Admission medications   Medication Sig Start Date End Date Taking? Authorizing Provider  oxyCODONE-acetaminophen (PERCOCET/ROXICET) 5-325 MG tablet Take 1 tablet by mouth every 6 (six) hours as needed for severe pain. 09/05/18  Yes [name], MD  acetaminophen (TYLENOL) 325 MG tablet Take 650 mg by mouth every 6 (six) hours as needed for moderate pain.    [provider]  ibuprofen (ADVIL,MOTRIN) 200 MG tablet Take 200 mg by mouth daily as needed (pain).    [provider]     Family History  Problem Relation Age of Onset  . Diabetes Mother   . Neuropathy Mother   . Urolithiasis Father     Social History   Socioeconomic History  . Marital status: Married    Spouse name: Not on file  . Number of children: Not on file  . Years of education: Not on file  . Highest education level: Not on file  Occupational History  . Not on file  Social Needs  . Financial resource strain: Not on file  . Food insecurity:    Worry: Not on file    Inability: Not on file  .  Transportation needs:    Medical: Not on file    Non-medical: Not on file  Tobacco Use  . Smoking status: Current Every Day Smoker    Packs/day: 1.00    Years: 20.00    Pack years: 20.00    Types: Cigarettes  . Smokeless tobacco: Never Used  Substance and Sexual Activity  . Alcohol use: Yes    Comment: ocassinally  . Drug use: Yes    Types: Marijuana    Comment: occasionally last used 09-22-18  . Sexual activity: Not on file  Lifestyle  . Physical activity:    Days per week: Not on file    Minutes per session: Not on file  . Stress: Not on file  Relationships  . Social connections:    Talks on phone: Not on file    Gets together: Not on file    Attends religious service: Not on file    Active member of club or organization: Not on file    Attends meetings of clubs or organizations: Not on file    Relationship status: Not on file  Other Topics Concern  . Not on file  Social History Narrative  . Not on file      Review of Systems denies fever, headache, chest pain, dyspnea, cough, abdominal pain, nausea, vomiting or bleeding.  He does have some intermittent left flank pain.  Vital Signs:pend   Physical Exam  awake, alert.  Chest with distant breath sounds bilaterally.  Heart with regular rate and rhythm.  Abdomen soft, positive bowel sounds, nontender.  Mild left CVA tenderness. No lower extremity edema.  Imaging: Ct Renal Stone Study  Result Date: 09/05/2018 CLINICAL DATA:  42 year old male with intermittent left flank pain since last night and unable to urinate. History of stones. Subsequent encounter. EXAM: CT ABDOMEN AND PELVIS WITHOUT CONTRAST TECHNIQUE: Multidetector CT imaging of the abdomen and pelvis was performed following the standard protocol without IV contrast. COMPARISON:  04/16/2016 CT. FINDINGS: Lower chest: Scarring/atelectasis lung bases. Heart size within normal limits. Hepatobiliary: Enlarged liver spanning over 20.6 cm. Taking into account limitation  by non contrast imaging, no worrisome hepatic lesion. No calcified gallstone. Pancreas: Taking into account limitation by non contrast imaging, no worrisome pancreatic mass or inflammation. Spleen: Taking into account limitation by non contrast imaging, no splenic mass or enlargement. Adrenals/Urinary Tract: 1.5 cm obstructing stone left ureteral pelvic junction with moderate left-sided hydronephrosis. Left upper pole scattered tiny nonobstructing renal calculi measuring up to 2.5 mm. Right upper pole tiny nonobstructing renal calculi. Slightly lobulated contour of the kidneys. Taking into account limitation by non contrast imaging, no obvious renal mass. Noncontrast filled views of the urinary bladder without abnormality noted. No adrenal lesion. Stomach/Bowel: No extraluminal bowel inflammatory process. Vascular/Lymphatic: Trace calcified plaque aortic bifurcation and common iliac arteries. No abdominal aortic aneurysm. Scattered normal size lymph nodes. Reproductive: Top-normal size prostate gland with left paracentral coarse calcification. Other: No free air or bowel containing hernia. Musculoskeletal: No osseous destructive lesion. IMPRESSION: 1. 1.5 cm obstructing stone left ureteral pelvic junction with moderate left-sided hydronephrosis. Left upper pole scattered tiny nonobstructing renal calculi measuring up to 2.5 mm. Right upper pole tiny nonobstructing renal calculi. 2.  Aortic Atherosclerosis (ICD10-I70.0). 3. Top-normal size prostate gland. 4. Elongated liver spanning over 20.6 cm. Electronically Signed   By: Lacy Duverney M.D.   On: 09/05/2018 11:30    Labs:  CBC: Recent Labs    09/05/18 1108  WBC 10.8*  HGB 16.4  HCT 47.0  PLT 257    COAGS: No results for input(s): INR, APTT in the last 8760 hours.  BMP: Recent Labs    09/05/18 1108  NA 141  K 4.1  CL 105  CO2 28  GLUCOSE 108*  BUN 18  CALCIUM 9.3  CREATININE 1.04  GFRNONAA >60  GFRAA >60    LIVER FUNCTION TESTS: No  results for input(s): BILITOT, AST, ALT, ALKPHOS, PROT, ALBUMIN in the last 8760 hours.  TUMOR MARKERS: No results for input(s): AFPTM, CEA, CA199, CHROMGRNA in the last 8760 hours.  Assessment and Plan: 42 y.o. male smoker with history of left flank pain and 1.5 cm obstructive left UPJ stone with moderate hydronephrosis who presents today for left percutaneous nephrostomy/nephroureteral catheter placement prior to nephrolithotomy.Risks and benefits of procedure were discussed with the patient including, but not limited to, infection, bleeding, significant bleeding causing loss or decrease in renal function or damage to adjacent structures.   All of the patient's questions were answered, patient is agreeable to proceed.  Consent signed and in chart.  LABS PENDING    Thank you for this interesting consult.  I greatly enjoyed meeting Laquentin Loudermilk and look forward to participating in their care.  A copy of this report was sent to the requesting provider on this date.  Electronically Signed: D. Jeananne Rama, PA-C 09/26/2018, 8:30 AM   I spent a total of  25 minutes  in face to face in clinical consultation, greater than 50% of which was counseling/coordinating care for left percutaneous nephrostomy/nephroureteral catheter placement

## 2018-09-26 NOTE — Procedures (Signed)
  Procedure: Left antegrade nephoureteral x2   EBL:   minimal Complications:  none immediate  See full dictation in [employer].  Thora Lance MD Main # [phone] Pager  [phone]

## 2018-09-26 NOTE — ED Provider Notes (Signed)
[facility]-EMERGENCY DEPT Provider Note   CSN: [id number] Arrival date & time: 09/26/18  1743     History   Chief Complaint Chief Complaint  Patient presents with  . Flank Pain    HPI Jared Serrano is a 42 y.o. male.  The history is provided by the patient, a parent and medical records. No language interpreter was used.  Flank Pain  This is a recurrent problem. The current episode started more than 2 days ago. The problem occurs constantly. The problem has been rapidly worsening. Associated symptoms include abdominal pain. Pertinent negatives include no chest pain, no headaches and no shortness of breath. The symptoms are aggravated by twisting. Nothing relieves the symptoms. He has tried nothing for the symptoms. The treatment provided no relief.    Past Medical History:  Diagnosis Date  . Anxiety    no recent trouble with  . Anxiety   . History of kidney stones    passed 2 in age 3's or 22's  . Kidney stone   . UTI (lower urinary tract infection)     Patient Active Problem List   Diagnosis Date Noted  . Left nephrolithiasis 09/05/2018  . Kidney stone 09/05/2018    Past Surgical History:  Procedure Laterality Date  . IR URETERAL STENT LEFT NEW ACCESS W/O SEP NEPHROSTOMY CATH  09/26/2018  . IR URETERAL STENT RIGHT NEW ACCESS W/SEP NEPHROSTOMY CATH  09/26/2018  . NO PAST SURGERIES    . WISDOM TOOTH EXTRACTION          Home Medications    Prior to Admission medications   Medication Sig Start Date End Date Taking? Authorizing Provider  acetaminophen (TYLENOL) 325 MG tablet Take 650 mg by mouth every 6 (six) hours as needed for moderate pain.    [provider]  ibuprofen (ADVIL,MOTRIN) 200 MG tablet Take 200 mg by mouth daily as needed (pain).    [provider]  oxyCODONE-acetaminophen (PERCOCET/ROXICET) 5-325 MG tablet Take 1 tablet by mouth every 6 (six) hours as needed for severe pain. 09/05/18   [name], MD    Family  History Family History  Problem Relation Age of Onset  . Diabetes Mother   . Neuropathy Mother   . Urolithiasis Father     Social History Social History   Tobacco Use  . Smoking status: Current Every Day Smoker    Packs/day: 1.00    Years: 20.00    Pack years: 20.00    Types: Cigarettes  . Smokeless tobacco: Never Used  Substance Use Topics  . Alcohol use: Yes  . Drug use: Yes    Types: Marijuana    Comment: Last used: 2 days ago      Allergies   Patient has no known allergies.   Review of Systems Review of Systems  Constitutional: Positive for chills. Negative for activity change, diaphoresis, fatigue and fever.  HENT: Negative for congestion and rhinorrhea.   Eyes: Negative for visual disturbance.  Respiratory: Negative for cough, chest tightness, shortness of breath, wheezing and stridor.   Cardiovascular: Negative for chest pain, palpitations and leg swelling.  Gastrointestinal: Positive for abdominal pain. Negative for abdominal distention, blood in stool, constipation, diarrhea, nausea and vomiting.  Genitourinary: Positive for flank pain and hematuria. Negative for difficulty urinating, dysuria, frequency and penile pain.  Musculoskeletal: Positive for back pain. Negative for gait problem.  Skin: Negative for rash and wound.  Neurological: Negative for dizziness, weakness, light-headedness and headaches.  Psychiatric/Behavioral: Negative for agitation.  All other systems reviewed and are negative.    Physical Exam Updated Vital Signs BP (!) 153/88 (BP Location: Left Arm)   Pulse (!) 52   Resp 20   SpO2 98%   Physical Exam  Constitutional: He is oriented to person, place, and time. He appears well-developed and well-nourished. No distress.  HENT:  Head: Normocephalic and atraumatic.  Mouth/Throat: Oropharynx is clear and moist. No oropharyngeal exudate.  Eyes: Pupils are equal, round, and reactive to light. Conjunctivae are normal.  Neck: Normal range  of motion. Neck supple.  Cardiovascular: Normal rate and regular rhythm.  No murmur heard. Pulmonary/Chest: Effort normal and breath sounds normal. No respiratory distress. He has no wheezes. He has no rales. He exhibits no tenderness.  Abdominal: Soft. There is tenderness. There is CVA tenderness. There is no rigidity, no rebound and no guarding.    Musculoskeletal: He exhibits tenderness. He exhibits no edema.       Lumbar back: He exhibits tenderness and pain.       Back:  Neurological: He is alert and oriented to person, place, and time. No sensory deficit. He exhibits normal muscle tone.  Skin: Skin is warm and dry. He is not diaphoretic. No erythema. No pallor.  Psychiatric: He has a normal mood and affect.  Nursing note and vitals reviewed.    ED Treatments / Results  Labs (all labs ordered are listed, but only abnormal results are displayed) Labs Reviewed - No data to display  EKG None  Radiology Ir Ureteral Stent Left New Access W/o Sep Nephrostomy Cath  Result Date: 09/26/2018 CLINICAL DATA:  Left nephrolithiasis, preop percutaneous nephrolithotomy EXAM: PERCUTANEOUS NEPHROURETERAL CATHETER PLACEMENT x2 UNDER ULTRASOUND AND FLUOROSCOPIC GUIDANCE FLUOROSCOPY TIME:  2.6 min; 290  uGym2 DAP TECHNIQUE: The procedure, risks (including but not limited to bleeding, infection, organ damage ), benefits, and alternatives were explained to the patient. Questions regarding the procedure were encouraged and answered. The patient understands and consents to the procedure. As antibiotic prophylaxis, cefazolin 2 g was ordered pre-procedure and administered intravenously within one hour of incision. Leftflank region prepped with Betadine, draped in usual sterile fashion, infiltrated locally with 1% lidocaine. Intravenous Fentanyl and Versed were administered as conscious sedation during continuous monitoring of the patient's level of consciousness and physiological / cardiorespiratory status by  the radiology RN, with a total moderate sedation time of 24 minutes. Under real-time ultrasound guidance, the upper pole calculus was identified and a 21-gauge micropuncture needle was advanced into its posterior upper pole calyx. Ultrasound image documentation was saved. Urine spontaneously returned through the needle. Needle was exchanged over a guidewire for transitional dilator. Contrast injection confirmed appropriate positioning. 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 Contrast was utilized to opacify the renal collecting system and a small amount of gas injected to demonstrate posterior calices. Under real-time fluoroscopic guidance, a 21 gauge trocar needle was advanced into a posterior lower pole calyx. A 018 guidewire advanced easily passed the stone and down the ureter. Needle was exchanged over a guidewire for transitional dilator catheter was exchanged over a guidewire for a 5 [place] Kumpe catheter, advanced into the urinary bladder. The upper pole dilator was exchanged over a Bentson wire for a second 5 [place] Kumpe catheter, advanced into the urinary bladder. Radiograph confirms appropriate nephroureteral catheter positioning. Catheters capped and secured externally with 0 silk suture. The patient tolerated the procedure well. COMPLICATIONS: COMPLICATIONS None. IMPRESSION: 1. Technically successful left  percutaneous nephroureteral catheter placement x2. 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 The upper pole access additionally allows direct access to the smaller stone in the upper pole calyx. Electronically Signed   By: Corlis Leak M.D.   On: 09/26/2018 10:49   Ir Ureteral Stent Right New Access W/sep Nephrostomy Cath  Result Date: 09/26/2018 CLINICAL DATA:  Left nephrolithiasis, preop percutaneous nephrolithotomy EXAM:  PERCUTANEOUS NEPHROURETERAL CATHETER PLACEMENT x2 UNDER ULTRASOUND AND FLUOROSCOPIC GUIDANCE FLUOROSCOPY TIME:  2.6 min; 290  uGym2 DAP TECHNIQUE: The procedure, risks (including but not limited to bleeding, infection, organ damage ), benefits, and alternatives were explained to the patient. Questions regarding the procedure were encouraged and answered. The patient understands and consents to the procedure. As antibiotic prophylaxis, cefazolin 2 g was ordered pre-procedure and administered intravenously within one hour of incision. Leftflank region prepped with Betadine, draped in usual sterile fashion, infiltrated locally with 1% lidocaine. Intravenous Fentanyl and Versed were administered as conscious sedation during continuous monitoring of the patient's level of consciousness and physiological / cardiorespiratory status by the radiology RN, with a total moderate sedation time of 24 minutes. Under real-time ultrasound guidance, the upper pole calculus was identified and a 21-gauge micropuncture needle was advanced into its posterior upper pole calyx. Ultrasound image documentation was saved. Urine spontaneously returned through the needle. Needle was exchanged over a guidewire for transitional dilator. Contrast injection confirmed appropriate positioning. 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 Contrast was utilized to opacify the renal collecting system and a small amount of gas injected to demonstrate posterior calices. Under real-time fluoroscopic guidance, a 21 gauge trocar needle was advanced into a posterior lower pole calyx. A 018 guidewire advanced easily passed the stone and down the ureter. Needle was exchanged over a guidewire for transitional dilator catheter was exchanged over a guidewire for a 5 [place] Kumpe catheter, advanced into the urinary bladder. The upper pole dilator was exchanged over a Bentson wire for a second 5  [place] Kumpe catheter, advanced into the urinary bladder. Radiograph confirms appropriate nephroureteral catheter positioning. Catheters capped and secured externally with 0 silk suture. The patient tolerated the procedure well. COMPLICATIONS: COMPLICATIONS None. IMPRESSION: 1. Technically successful left percutaneous nephroureteral catheter placement x2. 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 The upper pole access additionally allows direct access to the smaller stone in the upper pole calyx. Electronically Signed   By: Corlis Leak M.D.   On: 09/26/2018 10:49    Procedures Procedures (including critical care time)  Medications Ordered in ED Medications  acetaminophen (TYLENOL) tablet 650 mg (has no administration in time range)  oxyCODONE (Oxy IR/ROXICODONE) immediate release tablet 5 mg (5 mg Oral Given 09/26/18 2111)  HYDROmorphone (DILAUDID) injection 0.5-1 mg (has no administration in time range)  senna-docusate (Senokot-S) tablet 1 tablet (has no administration in time range)  bisacodyl (DULCOLAX) suppository 10 mg (has no administration in time range)  sodium phosphate (FLEET) 7-19 GM/118ML enema 1 enema (has no administration in time range)  ondansetron (ZOFRAN) injection 4 mg (has no administration in time range)  dextrose 5 % and 0.45 % NaCl with KCl 20 mEq/L infusion (has no administration in time range)  opium-belladonna (B&O SUPPRETTES) 16.2-60 MG suppository 1 suppository (has no administration in time range)  HYDROmorphone (DILAUDID)  injection 1 mg (1 mg Intravenous Given 09/26/18 2029)  ondansetron (ZOFRAN) injection 4 mg (4 mg Intravenous Given 09/26/18 2028)     Initial Impression / Assessment and Plan / ED Course  I have reviewed the triage vital signs and the nursing notes.  Pertinent labs & imaging results that were available during my care of the patient were reviewed by  me and considered in my medical decision making (see chart for details).     Jared Serrano is a 42 y.o. male with a past medical history significant for anxiety and prior kidney stones who has a known 1.5 cm left UPJ stone with hydronephrosis status post nephrostomy tube today who presents with left flank pain.  Patient reports that he had nephrostomy tube placed today and was discharged home however he was given strict return precautions and upon going home, his pain worsened.  He reports is a 10 out of 10 pain in the left flank.  He reports nausea but no vomiting.  He says his urine has looked bloody but otherwise he has not had any dysuria.  He reports the pain is worse than it was before.  He says that he is having a urology procedure with Dr. Annabell Howells tomorrow morning.  He reports no fevers but does report a chill.  Given his return precautions, he is back.  He reports that he had no other trauma and has not had any control of his pain since discharge.  On exam, patient has a surgical bandage on his left CVA area.  Surrounding tenderness.  No significant bleeding or drainage seen through the dressing.  Abdomen is nontender however left flank is very tender.  Lungs clear chest nontender.  Next  Clinical I suspect patient is having continued pain from both the stone and his recent procedure.  Given his return precautions with urology, they will be called for management recognitions.  He will given pain medicine and nausea medicine initially.  As he is going to surgery tomorrow, will await urology recognitions for any labs or imaging at this time.  Consult will be placed.  Urology will come see the patient and they will admit the patient for further management.  They did not want any further labs or imaging before admission.  Patient admitted in stable condition with improved pain after medications.   Final Clinical Impressions(s) / ED Diagnoses   Final diagnoses:  Left flank pain    ED Discharge  Orders    None      Clinical Impression: 1. Left flank pain     Disposition: Admit  This note was prepared with assistance of Dragon voice recognition software. Occasional wrong-word or sound-a-like substitutions may have occurred due to the inherent limitations of voice recognition software.      Genny Caulder, Canary Brim, MD 09/26/18 [phone]

## 2018-09-26 NOTE — ED Triage Notes (Signed)
Patient is scheduled for a nephrolithotomy in the morning. Patient is experiencing severe left flank pain. Reports hematuria but no nausea or vomiting. Denies fever at home.

## 2018-09-26 NOTE — ED Notes (Signed)
ED TO INPATIENT HANDOFF REPORT  Name/Age/Gender Jared Serrano 42 y.o. male  Code Status Code Status History    Date Active Date Inactive Code Status Order ID Comments User Context   09/05/2018 1308 09/05/2018 2020 Full Code [id number]  Irine Seal, MD ED      Home/SNF/Other Home  Chief Complaint Pain/Left Kidney  Level of Care/Admitting Diagnosis ED Disposition    ED Disposition Condition Comment   [place]: [facility] [[id number]]  Level of Care: Med-Surg [16]  Diagnosis: Nephrolithiasis [001749]  Admitting Physician: Irine Seal [[id number]]  Attending Physician: Irine Seal [[id number]]  PT Class (Do Not Modify): Observation [104]  PT Acc Code (Do Not Modify): Observation [10022]       Medical History Past Medical History:  Diagnosis Date  . Anxiety    no recent trouble with  . Anxiety   . History of kidney stones    passed 2 in age 49's or 89's  . Kidney stone   . UTI (lower urinary tract infection)     Allergies No Known Allergies  IV Location/Drains/Wounds Patient Lines/Drains/Airways Status   Active Line/Drains/Airways    None          Labs/Imaging Results for orders placed or performed during the hospital encounter of 09/26/18 (from the past 48 hour(s))  Basic metabolic panel     Status: Abnormal   Collection Time: 09/26/18  8:30 AM  Result Value Ref Range   Sodium 143 135 - 145 mmol/L   Potassium 4.1 3.5 - 5.1 mmol/L   Chloride 106 98 - 111 mmol/L   CO2 26 22 - 32 mmol/L   Glucose, Bld 99 70 - 99 mg/dL   BUN 27 (H) 6 - 20 mg/dL   Creatinine, Ser 1.10 0.61 - 1.24 mg/dL   Calcium 9.4 8.9 - 10.3 mg/dL   GFR calc non Af Amer >60 >60 mL/min   GFR calc Af Amer >60 >60 mL/min    Comment: (NOTE) The eGFR has been calculated using the CKD EPI equation. This calculation has not been validated in all clinical situations. eGFR's persistently <60 mL/min signify possible Chronic Kidney Disease.    Anion gap 11 5 - 15    Comment:  Performed at [facility], [place] [address], [place], [place] [address]  CBC with Differential/Platelet     Status: None   Collection Time: 09/26/18  8:30 AM  Result Value Ref Range   WBC 8.3 4.0 - 10.5 K/uL   RBC 5.24 4.22 - 5.81 MIL/uL   Hemoglobin 17.0 13.0 - 17.0 g/dL   HCT 48.4 39.0 - 52.0 %   MCV 92.4 78.0 - 100.0 fL   MCH 32.4 26.0 - 34.0 pg   MCHC 35.1 30.0 - 36.0 g/dL   RDW 14.1 11.5 - 15.5 %   Platelets 302 150 - 400 K/uL   Neutrophils Relative % 61 %   Neutro Abs 5.1 1.7 - 7.7 K/uL   Lymphocytes Relative 28 %   Lymphs Abs 2.3 0.7 - 4.0 K/uL   Monocytes Relative 6 %   Monocytes Absolute 0.5 0.1 - 1.0 K/uL   Eosinophils Relative 4 %   Eosinophils Absolute 0.3 0.0 - 0.7 K/uL   Basophils Relative 1 %   Basophils Absolute 0.1 0.0 - 0.1 K/uL    Comment: Performed at [facility], [place] [address], [place], [place] [address]  Protime-INR     Status: None   Collection Time: 09/26/18  8:30  AM  Result Value Ref Range   Prothrombin Time 12.0 11.4 - 15.2 seconds   INR 0.89     Comment: Performed at [facility], [place] [address] [place],  [address]   Ir Ureteral Stent Left New Access W/o Sep Nephrostomy Cath  Result Date: 09/26/2018 CLINICAL DATA:  Left nephrolithiasis, preop percutaneous nephrolithotomy EXAM: PERCUTANEOUS NEPHROURETERAL CATHETER PLACEMENT x2 UNDER ULTRASOUND AND FLUOROSCOPIC GUIDANCE FLUOROSCOPY TIME:  2.6 min; 290  uGym2 DAP TECHNIQUE: The procedure, risks (including but not limited to bleeding, infection, organ damage ), benefits, and alternatives were explained to the patient. Questions regarding the procedure were encouraged and answered. The patient understands and consents to the procedure. As antibiotic prophylaxis, cefazolin 2 g was ordered pre-procedure and administered intravenously within one hour of incision. Leftflank region prepped with Betadine, draped in usual sterile fashion, infiltrated  locally with 1% lidocaine. Intravenous Fentanyl and Versed were administered as conscious sedation during continuous monitoring of the patient's level of consciousness and physiological / cardiorespiratory status by the radiology RN, with a total moderate sedation time of 24 minutes. Under real-time ultrasound guidance, the upper pole calculus was identified and a 21-gauge micropuncture needle was advanced into its posterior upper pole calyx. Ultrasound image documentation was saved. Urine spontaneously returned through the needle. Needle was exchanged over a guidewire for transitional dilator. Contrast injection confirmed appropriate positioning. 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 Contrast was utilized to opacify the renal collecting system and a small amount of gas injected to demonstrate posterior calices. Under real-time fluoroscopic guidance, a 21 gauge trocar needle was advanced into a posterior lower pole calyx. A 018 guidewire advanced easily passed the stone and down the ureter. Needle was exchanged over a guidewire for transitional dilator catheter was exchanged over a guidewire for a 5 [place] Kumpe catheter, advanced into the urinary bladder. The upper pole dilator was exchanged over a Bentson wire for a second 5 [place] Kumpe catheter, advanced into the urinary bladder. Radiograph confirms appropriate nephroureteral catheter positioning. Catheters capped and secured externally with 0 silk suture. The patient tolerated the procedure well. COMPLICATIONS: COMPLICATIONS None. IMPRESSION: 1. Technically successful left percutaneous nephroureteral catheter placement x2. 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 The upper pole access additionally allows direct access to the smaller stone in the upper pole calyx. Electronically  Signed   By: Lucrezia Europe M.D.   On: 09/26/2018 10:49   Ir Ureteral Stent Right New Access W/sep Nephrostomy Cath  Result Date: 09/26/2018 CLINICAL DATA:  Left nephrolithiasis, preop percutaneous nephrolithotomy EXAM: PERCUTANEOUS NEPHROURETERAL CATHETER PLACEMENT x2 UNDER ULTRASOUND AND FLUOROSCOPIC GUIDANCE FLUOROSCOPY TIME:  2.6 min; 290  uGym2 DAP TECHNIQUE: The procedure, risks (including but not limited to bleeding, infection, organ damage ), benefits, and alternatives were explained to the patient. Questions regarding the procedure were encouraged and answered. The patient understands and consents to the procedure. As antibiotic prophylaxis, cefazolin 2 g was ordered pre-procedure and administered intravenously within one hour of incision. Leftflank region prepped with Betadine, draped in usual sterile fashion, infiltrated locally with 1% lidocaine. Intravenous Fentanyl and Versed were administered as conscious sedation during continuous monitoring of the patient's level of consciousness and physiological / cardiorespiratory status by the radiology RN, with a total moderate sedation time of 24 minutes. Under real-time ultrasound guidance, the upper pole calculus was identified and a 21-gauge  micropuncture needle was advanced into its posterior upper pole calyx. Ultrasound image documentation was saved. Urine spontaneously returned through the needle. Needle was exchanged over a guidewire for transitional dilator. Contrast injection confirmed appropriate positioning. 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 Contrast was utilized to opacify the renal collecting system and a small amount of gas injected to demonstrate posterior calices. Under real-time fluoroscopic guidance, a 21 gauge trocar needle was advanced into a posterior lower pole calyx. A 018 guidewire advanced easily passed the stone and down the ureter. Needle was exchanged  over a guidewire for transitional dilator catheter was exchanged over a guidewire for a 5 [place] Kumpe catheter, advanced into the urinary bladder. The upper pole dilator was exchanged over a Bentson wire for a second 5 [place] Kumpe catheter, advanced into the urinary bladder. Radiograph confirms appropriate nephroureteral catheter positioning. Catheters capped and secured externally with 0 silk suture. The patient tolerated the procedure well. COMPLICATIONS: COMPLICATIONS None. IMPRESSION: 1. Technically successful left percutaneous nephroureteral catheter placement x2. 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 The upper pole access additionally allows direct access to the smaller stone in the upper pole calyx. Electronically Signed   By: Lucrezia Europe M.D.   On: 09/26/2018 10:49    Pending Labs Unresulted Labs (From admission, onward)   None      Vitals/Pain Today's Vitals   09/26/18 1808 09/26/18 1938 09/26/18 2008  BP: (!) 153/88 (!) 146/88 (!) 146/80  Pulse: (!) 52 (!) 48 (!) 51  Resp: 20 16   SpO2: 98% 95% 95%  PainSc: 10-Worst pain ever      Isolation Precautions No active isolations  Medications Medications  HYDROmorphone (DILAUDID) injection 1 mg (has no administration in time range)  ondansetron (ZOFRAN) injection 4 mg (has no administration in time range)    Mobility walks with device

## 2018-09-26 NOTE — Progress Notes (Signed)
Pt in discomfort after being dosed with Vicodin twice. Dr. Rica Records notified.

## 2018-09-26 NOTE — Progress Notes (Signed)
Dr. Rica Records at bedside, notified that pt had scant amount of bloody drainage to bandage.   Dr. Deanne Coffer is following up with Dr. Annabell Howells and will call back with orders.

## 2018-09-26 NOTE — Discharge Instructions (Signed)
Moderate Conscious Sedation, Adult, Care After °These instructions provide you with information about caring for yourself after your procedure. Your health care provider may also give you more specific instructions. Your treatment has been planned according to current medical practices, but problems sometimes occur. Call your health care provider if you have any problems or questions after your procedure. °What can I expect after the procedure? °After your procedure, it is common: °· To feel sleepy for several hours. °· To feel clumsy and have poor balance for several hours. °· To have poor judgment for several hours. °· To vomit if you eat too soon. ° °Follow these instructions at home: °For at least 24 hours after the procedure: ° °· Do not: °? Participate in activities where you could fall or become injured. °? Drive. °? Use heavy machinery. °? Drink alcohol. °? Take sleeping pills or medicines that cause drowsiness. °? Make important decisions or sign legal documents. °? Take care of children on your own. °· Rest. °Eating and drinking °· Follow the diet recommended by your health care provider. °· If you vomit: °? Drink water, juice, or soup when you can drink without vomiting. °? Make sure you have little or no nausea before eating solid foods. °General instructions °· Have a responsible adult stay with you until you are awake and alert. °· Take over-the-counter and prescription medicines only as told by your health care provider. °· If you smoke, do not smoke without supervision. °· Keep all follow-up visits as told by your health care provider. This is important. °Contact a health care provider if: °· You keep feeling nauseous or you keep vomiting. °· You feel light-headed. °· You develop a rash. °· You have a fever. °Get help right away if: °· You have trouble breathing. °This information is not intended to replace advice given to you by your health care provider. Make sure you discuss any questions you have  with your health care provider. °Document Released: 10/04/2013 Document Revised: 05/18/2016 Document Reviewed: 04/04/2016 °Elsevier Interactive Patient Education © 2018 Else[employer] Inc[employer] °Percutaneous Nephrostomy Home Guide °Percutaneous nephrostomy is a procedure to insert a flexible tube into your kidney so that urine can leave your body. This procedure may be done if a medical condition prevents urine from leaving your kidney in the usual way. During the procedure, the nephrostomy tube is inserted in the right or left side of your lower back and is connected to an external drainage bag. °After you have a nephrostomy tube placed, urine will collect in the drainage bag outside of your body. You will need to empty and change the drainage bag as needed. You will also need to take steps to care for the area where the nephrostomy tube was inserted (tube insertion site). °How do I care for my nephrostomy tube? °· Always keep your tubing, the leg bag, or the bedside drainage bag below the level of your kidney so that your urine drains freely. °· Avoid activities that would cause bending or pulling of your tubing. Ask your health care provider what activities are safe for you. °· When connecting your nephrostomy tube to a drainage bag, make sure that there are no kinks in the tubing and that your urine is draining freely. You may want to gently wrap an elastic bandage over the tubing. This will help keep the tubing in place and prevent it from kinking. Make sure there is no tension on the tubing so it does not become dislodged. °· At night, you   may want to connect your nephrostomy tube or the leg bag to a larger bedside drainage bag. How do I empty the drainage bag? Empty the leg bag or bedside drainage bag whenever it becomes ? full. Also empty it before you go to sleep. Most drainage bags have a drain at the bottom that allows urine to be emptied. Follow these basic steps: 1. Hold the drainage bag over a toilet or  collection container. Use a measuring container if your health care provider told you to measure your urine. 2. Open the drain of the bag and allow the urine to drain out. 3. After all the urine has drained from the drainage bag, close the drain fully. 4. Flush the urine down the toilet. If a collection container was used, rinse the container.  How do I change the dressing around the nephrostomy tube? Change your dressing and clean your tube exit site as told by your health care provider. You may need to change the dressing every day for the first 2 weeks after having a nephrostomy tube inserted. After the first 2 weeks, you may be told to change the dressing two times a week. Supplies needed:  Mild soap and water.  Split gauze pads, 4  4 inches (10 x 10 cm).  Gauze pads, 4  4 inches (10 x 10 cm).  Paper tape. How to change the dressing: Because of the location of your nephrostomy tube, you may need help from another person to complete dressing changes. Follow these basic steps: 1. Wash hands with soap and water. 2. Gently remove the tape and dressing from around the nephrostomy tube. Be careful not to pull on the tube while removing the dressing. Avoid using scissors because they may damage the tube. 3. Wash the skin around the tube with mild soap and water, rinse well, and pat the skin dry with a clean cloth. 4. Check the skin around the drain for redness, swelling, pus, warmth, or a bad smell. 5. If the drain was sutured to the skin, check the suture to verify that it is still anchored in the skin. 6. Place two split gauze pads in and around the tube exit site. Do not apply ointments or alcohol to the site. 7. Place a gauze pad on top of the split gauze pad. 8. Coil the tube on top of the gauze. The tubing should rest on the gauze, not on the skin. 9. Place tape around each edge of the gauze pad. 10. Secure the nephrostomy tubing. Make sure that the tube does not kink or become  pinched. The tubing should rest on the gauze pad, not on the skin. 11. Dispose of used supplies properly.  How do I flush my nephrostomy tube? Use a saline syringe to rinse out (flush) your nephrostomy tube as told by your health care provider. Flushing is easier if a three-way stopcock is placed between the tube and the drainage bag. One connection of the stopcock connects to your tube, the second connects to the drainage bag, and the third is usually covered with a cap. The lever on the stopcock points to the direction on the stopcock that is closed to flow. Normally, the lever points in the direction of the cap to allow urine to drain from the tube to the drainage bag. Supplies needed:  Rubbing alcohol wipe.  10 mL 0.9% saline syringe. How to flush the tube: 1. Move the lever of the three-way stopcock so it points toward the drainage bag. 2.  Clean the cap with a rubbing alcohol wipe. °3. Screw the tip of a 10 mL 0.9% saline syringe onto the cap. °4. Using the syringe plunger, slowly push the 10 mL 0.9% saline in the syringe over 5-10 seconds. If resistance is met or pain occurs while pushing, stop pushing the saline. °5. Remove the syringe from the cap. °6. Return the stopcock lever to the usual position, pointing in the direction of the cap. °7. Dispose of used supplies properly. °How do I replace the drainage bag? °Replace the drainage bag, three-way stopcock, and any extension tubing as told by your health care provider. Make sure you always have an extra drainage bag and connecting tubing available. °1. Empty urine from your drainage bag. °2. Gather a new drainage bag, three-way stopcock, and any extension tubing. °3. Remove the drainage bag, three-way stopcock, and any extension tubing from the nephrostomy tube. °4. Attach the new leg bag or bedside drainage bag, three-way stopcock, and any extension tubing to the nephrostomy tube. °5. Dispose of the used drainage bag, three-way stopcock, and any  extension. ° °Contact a health care provider if: °· You have problems with any of the valves or tubing. °· You have persistent pain or soreness in your back. °· You have more redness, swelling, or pain around your tube insertion site. °· You have more fluid or blood coming from your tube insertion site. °· Your tube insertion site feels warm to the touch. °· You have pus or a bad smell coming from your tube insertion site. °· You have increased urine output or you feel burning when urinating. °Get help right away if: °· You have pain in your abdomen during the first week. °· You have chest pain or have trouble breathing. °· You have a new appearance of blood in your urine. °· You have a fever or chills. °· You have back pain that is not relieved by your medicine. °· You have decreased urine output. °· Your nephrostomy tube comes out. °This information is not intended to replace advice given to you by your health care provider. Make sure you discuss any questions you have with your health care provider. °Document Released: 10/04/2013 Document Revised: 09/25/2016 Document Reviewed: 09/25/2016 °Elsevier Interactive Patient Education © 2018 Else[employer] Inc. °Percutaneous Nephrostomy, Care After °This sheet gives you information about how to care for yourself after your procedure. Your health care provider may also give you more specific instructions. If you have problems or questions, contact your health care provider. °What can I expect after the procedure? °After the procedure, it is common to have: °· Some soreness where the nephrostomy tube was inserted (tube insertion site). °· Blood-tinged drainage from the nephrostomy tube for the first 24 hours. ° °Follow these instructions at home: °Activity °· Return to your normal activities as told by your health care provider. Ask your health care provider what activities are safe for you. °· Avoid activities that may cause the nephrostomy tubing to bend. °· Do not take baths,  swim, or use a hot tub until your health care provider approves. Ask your health care provider if you can take showers. Cover the nephrostomy tube dressing with a watertight covering when you take a shower. °· Do not drive for 24 hours if you were given a medicine to help you relax (sedative). °Care of the tube insertion site °· Follow instructions from your health care provider about how to take care of your tube insertion site. Make sure you: °? Wash your hands   with soap and water before you change your bandage (dressing). If soap and water are not available, use hand sanitizer. ? Change your dressing as told by your health care provider. Be careful not to pull on the tube while removing the dressing. ? When you change the dressing, wash the skin around the tube, rinse well, and pat the skin dry.  Check the tube insertion area every day for signs of infection. Check for: ? More redness, swelling, or pain. ? More fluid or blood. ? Warmth. ? Pus or a bad smell. Care of the nephrostomy tube and drainage bag  Always keep the tubing, the leg bag, or the bedside drainage bags below the level of the kidney so that your urine drains freely.  When connecting your nephrostomy tube to a drainage bag, make sure that there are no kinks in the tubing and that your urine is draining freely. You may want to use an elastic bandage to wrap any exposed tubing that goes from the nephrostomy tube to any of the connecting tubes.  At night, you may want to connect your nephrostomy tube or the leg bag to a larger bedside drainage bag.  Follow instructions from your health care provider about how to empty or change the drainage bag.  Empty the drainage bag when it becomes ? full.  Replace the drainage bag and any extension tubing that is connected to your nephrostomy tube every 3 weeks or as often as told by your health care provider. Your health care provider will explain how to change the drainage bag and extension  tubing. General instructions  Take over-the-counter and prescription medicines only as told by your health care provider.  Keep all follow-up visits as told by your health care provider. This is important. Contact a health care provider if:  You have problems with any of the valves or tubing.  You have persistent pain or soreness in your back.  You have more redness, swelling, or pain around your tube insertion site.  You have more fluid or blood coming from your tube insertion site.  Your tube insertion site feels warm to the touch.  You have pus or a bad smell coming from your tube insertion site.  You have increased urine output or you feel burning when urinating. Get help right away if:  You have pain in your abdomen during the first week.  You have chest pain or have trouble breathing.  You have a new appearance of blood in your urine.  You have a fever or chills.  You have back pain that is not relieved by your medicine.  You have decreased urine output.  Your nephrostomy tube comes out. This information is not intended to replace advice given to you by your health care provider. Make sure you discuss any questions you have with your health care provider. Document Released: 08/06/2004 Document Revised: 09/25/2016 Document Reviewed: 09/25/2016 Elsevier Interactive Patient Education  [employer].

## 2018-09-26 NOTE — H&P (Signed)
Subjective: CC: Left flank pain.  Hx: Jared Serrano is a 42 yo WM who I was asked to see in consultation by Dr. Si Gaul for 1.5cm left renal pelvic stone with pain.   Patient [facility] ED on 09/05/18 with acute left flank pain x 24 hours. Also difficulty with urination. Pain progressed overnight, severe this AM. No N/V. No other symptoms. No LUTS.   CT renal colic demonstrated a 1.5 cm obstructing left UPJ stone with mild proximal hydronephrosis. No other ureteral stones.   UA 09/05/18: small LE, neg nitrites, 11-20 WBC, 6-10 RBC, rare bacteria WBC 10.8 Cr 1.04  Received pain meds in ED. Currently pain is minimal and he is very comfortable. He is inquiring about discharge to home if surgery isn't performed tonight.   +personal history of nephrolithiasis. All spontaneously passed. No urologic procedures. Last stone event was several years ago. +family hx of stones in father. He denies any other medical problems. No past surgeries. No meds. Allergic to poison ivy.   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   He was sent home but returns with intractable pain and nausea but no fever.   ROS:  ROS  No Known Allergies  Past Medical History:  Diagnosis Date  . Anxiety   . Anxiety   . Kidney stone   . UTI (lower urinary tract infection)     History reviewed. No pertinent surgical history.  Social History        Socioeconomic History  . Marital status: Married    Spouse name: Not on file  . Number of children: Not on file  . Years of education: Not on file  . Highest education level: Not on file  Occupational History  . Not on file  Social Needs  . Financial resource strain: Not on file  . Food insecurity:    Worry: Not on file    Inability: Not on file  . Transportation needs:    Medical: Not on file    Non-medical: Not on file   Tobacco Use  . Smoking status: Current Every Day Smoker    Packs/day: 1.00    Types: Cigarettes  . Smokeless tobacco: Never Used  Substance and Sexual Activity  . Alcohol use: Yes    Comment: ocassinally  . Drug use: Yes    Types: Marijuana    Comment: occasionally  . Sexual activity: Not on file  Lifestyle  . Physical activity:    Days per week: Not on file    Minutes per session: Not on file  . Stress: Not on file  Relationships  . Social connections:    Talks on phone: Not on file    Gets together: Not on file    Attends religious service: Not on file    Active member of club or organization: Not on file    Attends meetings of clubs or organizations: Not on file    Relationship status: Not on file  . Intimate partner violence:    Fear of current or ex partner: Not on file    Emotionally abused: Not on file    Physically abused: Not on file    Forced sexual activity: Not on file  Other Topics Concern  . Not on file  Social History Narrative  . Not on file         Family History  Problem Relation Age of Onset  . Diabetes Mother   . Neuropathy Mother   . Urolithiasis Father     Anti-infectives:    Anti-infectives (From admission, onward)   None               Current Facility-Administered Medications  Medication Dose Route Frequency Provider Last Rate Last Dose  . 0.45 % NaCl with KCl 20 mEq / L infusion   Intravenous Continuous [name], MD      . acetaminophen (TYLENOL) tablet 650 mg  650 mg Oral Q4H PRN [name], MD      . HYDROcodone-acetaminophen (NORCO/VICODIN) 5-325 MG per tablet 1-2 tablet  1-2 tablet Oral Q4H PRN [name], MD      . HYDROmorphone (DILAUDID) injection 0.5-1 mg  0.5-1 mg Intravenous Q2H PRN [name], MD      . Melene Muller ON 09/06/2018] Influenza vac split quadrivalent PF (FLUARIX) injection 0.5 mL  0.5 mL Intramuscular Tomorrow-1000 [name], MD      . ondansetron [facility])  injection 4 mg  4 mg Intravenous Q4H PRN [name], MD      . zolpidem (AMBIEN) tablet 5 mg  5 mg Oral QHS PRN,MR X 1 [name], MD         Objective: Vital signs in last 24 hours: Temp:  [97.5 F (36.4 C)-98.7 F (37.1 C)] 98.7 F (37.1 C) (09/09 1355) Pulse Rate:  [60-98] 98 (09/09 1355) Resp:  [16-17] 16 (09/09 1355) BP: (135-155)/(82-94) 155/88 (09/09 1355) SpO2:  [98 %-100 %] 98 % (09/09 1355) Weight:  [71.6 kg-74.8 kg] 71.6 kg (09/09 1355)  Intake/Output from previous day: No intake/output data recorded. Intake/Output this shift: Total I/O In: 1000 [IV Piggyback:1000] Out: -    Physical Exam  Gen: pleasant, calm, no acute distress Pulm: normal work of breathing Cv: normal heart rate Abd: minimal left flank tenderness. Belly soft, nondistended Extrem: warm to touch, pink   Lab Results:  RecentLabs(last2labs)  Recent Labs    09/05/18 1108  WBC 10.8*  HGB 16.4  HCT 47.0  PLT 257     BMET RecentLabs(last2labs)     Recent Labs    09/05/18 1108  NA 141  K 4.1  CL 105  CO2 28  GLUCOSE 108*  BUN 18  CREATININE 1.04  CALCIUM 9.3     PT/INR RecentLabs(last2labs)  No results for input(s): LABPROT, INR in the last 72 hours.   ABG  RecentLabs(last2labs)  No results for input(s): PHART, HCO3 in the last 72 hours.  Invalid input(s): PCO2, PO2    Studies/Results:  ImagingResults(Last48hours)  Ct Renal Stone Study  Result Date: 09/05/2018 CLINICAL DATA:  42 year old male with intermittent left flank pain since last night and unable to urinate. History of stones. Subsequent encounter. EXAM: CT ABDOMEN AND PELVIS WITHOUT CONTRAST TECHNIQUE: Multidetector CT imaging of the abdomen and pelvis was performed following the standard protocol without IV contrast. COMPARISON:  04/16/2016 CT. FINDINGS: Lower chest: Scarring/atelectasis lung bases. Heart size within normal limits. Hepatobiliary: Enlarged liver spanning over  20.6 cm. Taking into account limitation by non contrast imaging, no worrisome hepatic lesion. No calcified gallstone. Pancreas: Taking into account limitation by non contrast imaging, no worrisome pancreatic mass or inflammation. Spleen: Taking into account limitation by non contrast imaging, no splenic mass or enlargement. Adrenals/Urinary Tract: 1.5 cm obstructing stone left ureteral pelvic junction with moderate left-sided hydronephrosis. Left upper pole scattered tiny nonobstructing renal calculi measuring up to 2.5 mm. Right upper pole tiny nonobstructing  renal calculi. Slightly lobulated contour of the kidneys. Taking into account limitation by non contrast imaging, no obvious renal mass. Noncontrast filled views of the urinary bladder without abnormality noted. No adrenal lesion. Stomach/Bowel: No extraluminal bowel inflammatory process. Vascular/Lymphatic: Trace calcified plaque aortic bifurcation and common iliac arteries. No abdominal aortic aneurysm. Scattered normal size lymph nodes. Reproductive: Top-normal size prostate gland with left paracentral coarse calcification. Other: No free air or bowel containing hernia. Musculoskeletal: No osseous destructive lesion. IMPRESSION: 1. 1.5 cm obstructing stone left ureteral pelvic junction with moderate left-sided hydronephrosis. Left upper pole scattered tiny nonobstructing renal calculi measuring up to 2.5 mm. Right upper pole tiny nonobstructing renal calculi. 2.  Aortic Atherosclerosis (ICD10-I70.0). 3. Top-normal size prostate gland. 4. Elongated liver spanning over 20.6 cm. Electronically Signed   By: Lacy Duverney M.D.   On: 09/05/2018 11:30      Assessment: 42 year old otherwise healthy male with a 1.5 cm obstructing LEFT UPJ stone.   He had placement of left NT's today with both  UP and LP tubes being placed.  He returned to the ER with intractable pain and nausea and will be admitted tonight for pain management in preparation for his PCNL  tomorrow.

## 2018-09-26 NOTE — Progress Notes (Signed)
Patient ID: Jared Serrano, male   DOB: 08/10/1976, 42 y.o.   MRN: [id number] Patient given prescriptions for Zofran 8 mg, #4, no refill, 1 tablet twice a day as needed for nausea; Levsin SL, 0.125 mg, #6, no refill, 1 tablet under tongue every 4-6 hours as needed for bladder spasms.  Azo-Standard also suggested for patient if needed for dysuria.  He will return to [facility] tomorrow for nephrolithotomy by Dr.Wrenn. Pt also seen by Dr. Deanne Coffer prior to d/c home. He was instructed to return to ED tonight if he develops significant abdominal pain, worsening nausea, vomiting or fever.

## 2018-09-27 ENCOUNTER — Encounter (HOSPITAL_COMMUNITY)
Admission: EM | Disposition: A | Discharge status: Home / Self Care | Payer: Self-pay | Source: Home / Self Care | Attending: Urology

## 2018-09-27 ENCOUNTER — Encounter (HOSPITAL_COMMUNITY): Payer: Self-pay | Admitting: *Deleted

## 2018-09-27 ENCOUNTER — Observation Stay (HOSPITAL_COMMUNITY): Payer: Self-pay

## 2018-09-27 ENCOUNTER — Ambulatory Visit (HOSPITAL_COMMUNITY): Admission: RE | Admit: 2018-09-27 | Payer: Self-pay | Source: Ambulatory Visit | Admitting: Urology

## 2018-09-27 ENCOUNTER — Observation Stay (HOSPITAL_COMMUNITY): Payer: Self-pay | Admitting: Anesthesiology

## 2018-09-27 HISTORY — PX: NEPHROLITHOTOMY: SHX5134

## 2018-09-27 LAB — HEMOGLOBIN AND HEMATOCRIT, BLOOD
HCT: 45.2 % (ref 39.0–52.0)
Hemoglobin: 15.5 g/dL (ref 13.0–17.0)

## 2018-09-27 LAB — SURGICAL PCR SCREEN
MRSA, PCR: NEGATIVE
STAPHYLOCOCCUS AUREUS: NEGATIVE

## 2018-09-27 LAB — TYPE AND SCREEN
ABO/RH(D): O NEG
ANTIBODY SCREEN: NEGATIVE

## 2018-09-27 LAB — ABO/RH: ABO/RH(D): O NEG

## 2018-09-27 SURGERY — NEPHROLITHOTOMY PERCUTANEOUS
Anesthesia: General | Laterality: Left

## 2018-09-27 MED ORDER — PROPOFOL 10 MG/ML IV BOLUS
INTRAVENOUS | Status: DC | PRN
  Administered 2018-09-27: 200 mg via INTRAVENOUS

## 2018-09-27 MED ORDER — 0.9 % SODIUM CHLORIDE (POUR BTL) OPTIME
TOPICAL | Status: DC | PRN
  Administered 2018-09-27: 1000 mL

## 2018-09-27 MED ORDER — ROCURONIUM BROMIDE 50 MG/5ML IV SOSY
PREFILLED_SYRINGE | INTRAVENOUS | Status: DC | PRN
  Administered 2018-09-27: 60 mg via INTRAVENOUS

## 2018-09-27 MED ORDER — BUPIVACAINE HCL (PF) 0.25 % IJ SOLN
INTRAMUSCULAR | Status: DC | PRN
  Administered 2018-09-27: 20 mL

## 2018-09-27 MED ORDER — SUGAMMADEX SODIUM 200 MG/2ML IV SOLN
INTRAVENOUS | Status: DC | PRN
  Administered 2018-09-27: 200 mg via INTRAVENOUS

## 2018-09-27 MED ORDER — HYDROMORPHONE HCL 1 MG/ML IJ SOLN
INTRAMUSCULAR | Status: AC
  Filled 2018-09-27: qty 1

## 2018-09-27 MED ORDER — IOHEXOL 300 MG/ML  SOLN
INTRAMUSCULAR | Status: DC | PRN
  Administered 2018-09-27: 25 mL

## 2018-09-27 MED ORDER — HYDROMORPHONE HCL 1 MG/ML IJ SOLN
0.2500 mg | INTRAMUSCULAR | Status: DC | PRN
  Administered 2018-09-27 (×3): 0.5 mg via INTRAVENOUS

## 2018-09-27 MED ORDER — DEXAMETHASONE SODIUM PHOSPHATE 10 MG/ML IJ SOLN
INTRAMUSCULAR | Status: DC | PRN
  Administered 2018-09-27: 10 mg via INTRAVENOUS

## 2018-09-27 MED ORDER — MIDAZOLAM HCL 2 MG/2ML IJ SOLN
INTRAMUSCULAR | Status: DC | PRN
  Administered 2018-09-27: 2 mg via INTRAVENOUS

## 2018-09-27 MED ORDER — FENTANYL CITRATE (PF) 100 MCG/2ML IJ SOLN
INTRAMUSCULAR | Status: DC | PRN
  Administered 2018-09-27 (×4): 50 ug via INTRAVENOUS

## 2018-09-27 MED ORDER — ONDANSETRON HCL 4 MG/2ML IJ SOLN
INTRAMUSCULAR | Status: DC | PRN
  Administered 2018-09-27: 4 mg via INTRAVENOUS

## 2018-09-27 MED ORDER — CEFAZOLIN SODIUM-DEXTROSE 2-4 GM/100ML-% IV SOLN
2.0000 g | INTRAVENOUS | Status: AC
  Administered 2018-09-27: 2 g via INTRAVENOUS

## 2018-09-27 MED ORDER — CIPROFLOXACIN HCL 500 MG PO TABS
500.0000 mg | ORAL_TABLET | Freq: Two times a day (BID) | ORAL | Status: DC
  Administered 2018-09-27 – 2018-09-29 (×4): 500 mg via ORAL
  Filled 2018-09-27 (×4): qty 1

## 2018-09-27 MED ORDER — BUPIVACAINE HCL (PF) 0.25 % IJ SOLN
INTRAMUSCULAR | Status: AC
  Filled 2018-09-27: qty 30

## 2018-09-27 MED ORDER — LACTATED RINGERS IV SOLN
INTRAVENOUS | Status: DC
  Administered 2018-09-27: 1000 mL via INTRAVENOUS
  Administered 2018-09-27: 10:00:00 via INTRAVENOUS

## 2018-09-27 MED ORDER — PROMETHAZINE HCL 25 MG/ML IJ SOLN: 6.2500 mg | INTRAMUSCULAR | Status: DC | PRN

## 2018-09-27 MED ORDER — LIDOCAINE 2% (20 MG/ML) 5 ML SYRINGE
INTRAMUSCULAR | Status: DC | PRN
  Administered 2018-09-27: 100 mg via INTRAVENOUS

## 2018-09-27 MED ORDER — SODIUM CHLORIDE 0.9 % IR SOLN
Status: DC | PRN
  Administered 2018-09-27: 8000 mL

## 2018-09-27 SURGICAL SUPPLY — 49 items
APL SKNCLS STERI-STRIP NONHPOA (GAUZE/BANDAGES/DRESSINGS) ×1
BAG URINE DRAINAGE (UROLOGICAL SUPPLIES) ×1 IMPLANT
BASKET ZERO TIP NITINOL 2.4FR (BASKET) IMPLANT
BENZOIN TINCTURE PRP APPL 2/3 (GAUZE/BANDAGES/DRESSINGS) ×3 IMPLANT
BLADE SURG 15 STRL LF DISP TIS (BLADE) ×1 IMPLANT
BLADE SURG 15 STRL SS (BLADE) ×3
BSKT STON RTRVL ZERO TP 2.4FR (BASKET)
CATCHER STONE W/TUBE ADAPTER (UROLOGICAL SUPPLIES) IMPLANT
CATH AINSWORTH 30CC 24FR (CATHETERS) IMPLANT
CATH FOLEY 2W COUNCIL 5CC 16FR (CATHETERS) ×2 IMPLANT
CATH ROBINSON RED A/P 20FR (CATHETERS) IMPLANT
CATH URET 5FR 28IN OPEN ENDED (CATHETERS) IMPLANT
CATH URET DUAL LUMEN 6-10FR 50 (CATHETERS) ×3 IMPLANT
CATH UROLOGY TORQUE 40 (MISCELLANEOUS) ×3 IMPLANT
CATH X-FORCE N30 NEPHROSTOMY (TUBING) ×3 IMPLANT
COVER SURGICAL LIGHT HANDLE (MISCELLANEOUS) IMPLANT
DRAPE C-ARM 42X120 X-RAY (DRAPES) ×3 IMPLANT
DRAPE LINGEMAN PERC (DRAPES) ×3 IMPLANT
DRAPE SURG IRRIG POUCH 19X23 (DRAPES) ×3 IMPLANT
DRSG PAD ABDOMINAL 8X10 ST (GAUZE/BANDAGES/DRESSINGS) ×2 IMPLANT
DRSG TEGADERM 8X12 (GAUZE/BANDAGES/DRESSINGS) ×6 IMPLANT
EXTRACTOR STONE NITINOL NGAGE (UROLOGICAL SUPPLIES) IMPLANT
FIBER LASER FLEXIVA 365 (UROLOGICAL SUPPLIES) IMPLANT
FIBER LASER TRAC TIP (UROLOGICAL SUPPLIES) IMPLANT
GAUZE SPONGE 4X4 12PLY STRL (GAUZE/BANDAGES/DRESSINGS) ×1 IMPLANT
GLOVE SURG SS PI 8.0 STRL IVOR (GLOVE) ×3 IMPLANT
GOWN STRL REUS W/TWL XL LVL3 (GOWN DISPOSABLE) ×3 IMPLANT
GUIDEWIRE AMPLAZ .035X145 (WIRE) ×6 IMPLANT
KIT BASIN OR (CUSTOM PROCEDURE TRAY) ×3 IMPLANT
MANIFOLD NEPTUNE II (INSTRUMENTS) ×3 IMPLANT
NEEDLE HYPO 22GX1.5 SAFETY (NEEDLE) ×2 IMPLANT
NS IRRIG 1000ML POUR BTL (IV SOLUTION) ×3 IMPLANT
PACK CYSTO (CUSTOM PROCEDURE TRAY) ×3 IMPLANT
PAD ABD 8X10 STRL (GAUZE/BANDAGES/DRESSINGS) ×4 IMPLANT
PROBE LITHOCLAST ULTRA 3.8X403 (UROLOGICAL SUPPLIES) IMPLANT
PROBE PNEUMATIC 1.0MMX570MM (UROLOGICAL SUPPLIES) IMPLANT
SET IRRIG Y TYPE TUR BLADDER L (SET/KITS/TRAYS/PACK) ×1 IMPLANT
SPONGE LAP 4X18 RFD (DISPOSABLE) ×3 IMPLANT
STONE CATCHER W/TUBE ADAPTER (UROLOGICAL SUPPLIES) IMPLANT
SUT SILK 2 0 30  PSL (SUTURE) ×2
SUT SILK 2 0 30 PSL (SUTURE) ×1 IMPLANT
SYR 10ML LL (SYRINGE) ×3 IMPLANT
SYR 20CC LL (SYRINGE) ×6 IMPLANT
TOWEL OR 17X26 10 PK STRL BLUE (TOWEL DISPOSABLE) ×3 IMPLANT
TOWEL OR NON WOVEN STRL DISP B (DISPOSABLE) ×3 IMPLANT
TRAY FOLEY MTR SLVR 16FR STAT (SET/KITS/TRAYS/PACK) ×3 IMPLANT
TUBING CONNECTING 10 (TUBING) ×3 IMPLANT
TUBING CONNECTING 10' (TUBING) ×1
WATER STERILE IRR 1000ML POUR (IV SOLUTION) ×2 IMPLANT

## 2018-09-27 NOTE — Progress Notes (Signed)
Pt with 625 ml out of nephrostomy tube and dressing around tube with moderate saturation. Dressing changed. Pt with "intense, stabbing" pain and PRN pain medication given. Will continue to monitor. Korrin Waterfield A

## 2018-09-27 NOTE — Anesthesia Preprocedure Evaluation (Signed)
Anesthesia Evaluation  Patient identified by MRN, date of birth, ID band Patient awake    Reviewed: Allergy & Precautions, NPO status , Patient's Chart, lab work & pertinent test results  Airway Mallampati: II  TM Distance: >3 FB Neck ROM: Full    Dental no notable dental hx.    Pulmonary Current Smoker,    Pulmonary exam normal breath sounds clear to auscultation       Cardiovascular negative cardio ROS Normal cardiovascular exam Rhythm:Regular Rate:Normal     Neuro/Psych negative neurological ROS  negative psych ROS   GI/Hepatic negative GI ROS, Neg liver ROS,   Endo/Other  negative endocrine ROS  Renal/GU negative Renal ROS  negative genitourinary   Musculoskeletal negative musculoskeletal ROS (+)   Abdominal   Peds negative pediatric ROS (+)  Hematology negative hematology ROS (+)   Anesthesia Other Findings   Reproductive/Obstetrics negative OB ROS                             Anesthesia Physical Anesthesia Plan  ASA: II  Anesthesia Plan: General   Post-op Pain Management:    Induction: Intravenous  PONV Risk Score and Plan: Ondansetron, Dexamethasone and Treatment may vary due to age or medical condition  Airway Management Planned: Oral ETT  Additional Equipment:   Intra-op Plan:   Post-operative Plan: Extubation in OR  Informed Consent: I have reviewed the patients History and Physical, chart, labs and discussed the procedure including the risks, benefits and alternatives for the proposed anesthesia with the patient or authorized representative who has indicated his/her understanding and acceptance.   Dental advisory given  Plan Discussed with: CRNA  Anesthesia Plan Comments:         Anesthesia Quick Evaluation

## 2018-09-27 NOTE — Progress Notes (Signed)
Patient ID: Jared Serrano, male   DOB: September 14, 1976, 42 y.o.   MRN: [id number]  I spoke to [facility] by phone and he has some post surgical pain but no other complaints.    Hgb is 15.2  BP (!) 160/83 (BP Location: Left Arm)   Pulse (!) 59   Temp 98.2 F (36.8 C) (Oral)   Resp 18   Ht 5\' 10"  (1.778 m)   Wt 73.6 kg   SpO2 100%   BMI 23.27 kg/m

## 2018-09-27 NOTE — Op Note (Signed)
Procedure: 1.  First stage left percutaneous nephrolithotomy less than  2 cm stone. 2.  Left antegrade nephrostogram through existing access.  Preop diagnosis: 1.5 cm left renal pelvic stone and 9 mm left upper pole stone.  Postop diagnosis: Same.  Surgeon: Dr. Irine Seal.  Anesthesia: General.  Specimen: Stone fragments.  Drain: 1.  Foley catheter. 2.  45 French left nephrostomy tube. 3.  6 French left nephroureteral catheter.  EBL: Approximately 150 mL's.  Complications: None.  Indications: Jared Serrano is a 42 year old white male with a 1.5 cm left renal pelvic stone and a 9 mm left upper pole stone.  Based on the density of the stone in the peri-stone inflammatory changes in the renal pelvis, it was felt that percutaneous nephrolithotomy was the most appropriate treatment option.  Procedure: He had undergone placement of upper and lower pole nephrostomy access catheters on 09/26/2018.  He was sent home but returned to the emergency room that night with pain and was admitted for pain management.  He comes to the operating room today for his procedure.  He was given 2 g of Ancef.  A general anesthetic was induced on the holding room stretcher and a 16 French Foley catheter was placed using sterile technique.  The balloon was filled with 10 mL of fluid and the catheter was placed to straight drainage.  He was then rolled prone on chest rolls with care taken to pad all pressure points.  The right arm was tucked.  PAS hose were fitted.  His nephrostomy site was prepped with ChloraPrep and he was draped in usual sterile fashion.  An Amplatz superstiff guidewire was placed through the lower pole access catheter to the bladder and the access catheter was removed.  The incision was lengthened to 2 cm with a knife.  A dual-lumen 8 French catheter was placed over the wire into the proximal ureter and a second Amplatz wire was passed to the bladder.  The dual-lumen catheter was removed and the nephrostomy  tract balloon dilation catheter was passed over 1 of the wires into the renal pelvis under fluoroscopic guidance.  The balloon was inflated to 20 atm and the nephrostomy sheath was advanced over the balloon into the renal pelvis.  The rigid nephroscope was then passed through the sheath along the wire and several clots were removed with grasping forceps.  The stone was identified and a anterior calyx and grasped with a grasping forceps but it was too large to remove intact through the sheath.  The trilogy lithotrite was then used to fragment the stone with aspiration of the small fragments and grasper extraction of the large fragments.  Once the lower pole is been rendered stone free, inspection of the renal pelvis revealed minimal stone debris.  The rigid scope was switched for the flexible scope which was then negotiated into the upper pole where the 9 mm stone was identified.  The 9 mm stone was adherent to the calyceal papilla but was easily removed with the engage stone basket.  Once visual and fluoroscopic inspection revealed no residual stones a 16 [place] [employer] catheter was placed over the working wire into the renal pelvis and the sheath was backed out partially.  The balloon was filled with 2 cc of sterile fluid.  The sheath was cut away from the catheter and pressure was held on the nephrostomy tract for 5 minutes with resolution of bleeding.  The nephrostomy catheter was secured to the skin with a 2-0 silk suture and the  wire was removed from the catheter.  The upper pole access sheath was also resecured to the skin with 2-0 silk suture.  An antegrade nephrostogram was performed through the 16 French catheter.  Nephrostogram revealed no extravasation and good antegrade flow with no significant intrarenal filling defects.  The 69 French catheter was attached to a drainage bag and the drapes were removed and a dressing was applied.  He was then rolled supine on the recovery room stretcher, his  anesthetic was reversed and he was moved to recovery room in stable condition.  There were no complications.

## 2018-09-27 NOTE — Transfer of Care (Signed)
Immediate Anesthesia Transfer of Care Note  Patient: Jared Serrano  Procedure(s) Performed: LEFT NEPHROLITHOTOMY PERCUTANEOUS (Left )  Patient Location: PACU  Anesthesia Type:General  Level of Consciousness: awake, alert  and patient cooperative  Airway & Oxygen Therapy: Patient Spontanous Breathing and Patient connected to face mask oxygen  Post-op Assessment: Report given to RN and Post -op Vital signs reviewed and stable  Post vital signs: Reviewed and stable  Last Vitals:  Vitals Value Taken Time  BP 163/109 09/27/2018  1:32 PM  Temp    Pulse 83 09/27/2018  1:34 PM  Resp 19 09/27/2018  1:34 PM  SpO2 100 % 09/27/2018  1:34 PM  Vitals shown include unvalidated device data.  Last Pain:  Vitals:   09/27/18 0833  TempSrc:   PainSc: 4       Patients Stated Pain Goal: 2 (09/27/18 0803)  Complications: No apparent anesthesia complications

## 2018-09-27 NOTE — Anesthesia Postprocedure Evaluation (Signed)
Anesthesia Post Note  Patient: Jared Serrano  Procedure(s) Performed: LEFT NEPHROLITHOTOMY PERCUTANEOUS (Left )     Patient location during evaluation: PACU Anesthesia Type: General Level of consciousness: awake and alert Pain management: pain level controlled Vital Signs Assessment: post-procedure vital signs reviewed and stable Respiratory status: spontaneous breathing, nonlabored ventilation, respiratory function stable and patient connected to nasal cannula oxygen Cardiovascular status: blood pressure returned to baseline and stable Postop Assessment: no apparent nausea or vomiting Anesthetic complications: no    Last Vitals:  Vitals:   09/27/18 0934 09/27/18 1332  BP: 138/87 (!) 163/109  Pulse: 61 (!) 52  Resp: 16 13  Temp:  36.7 C  SpO2: 94% 100%    Last Pain:  Vitals:   09/27/18 1345  TempSrc:   PainSc: 7                  Toby Ayad S

## 2018-09-27 NOTE — Anesthesia Procedure Notes (Signed)
Procedure Name: Intubation Date/Time: 09/27/2018 12:04 PM Performed by: Dione Booze, CRNA Pre-anesthesia Checklist: Suction available, Patient being monitored, Patient identified and Emergency Drugs available Patient Re-evaluated:Patient Re-evaluated prior to induction Oxygen Delivery Method: Circle system utilized Preoxygenation: Pre-oxygenation with 100% oxygen Induction Type: IV induction Ventilation: Mask ventilation without difficulty Laryngoscope Size: Mac and 4 Grade View: Grade I Tube type: Oral Tube size: 7.5 mm Number of attempts: 1 Airway Equipment and Method: Stylet Placement Confirmation: ETT inserted through vocal cords under direct vision,  positive ETCO2 and breath sounds checked- equal and bilateral Secured at: 22 cm Tube secured with: Tape Dental Injury: Teeth and Oropharynx as per pre-operative assessment

## 2018-09-27 NOTE — Interval H&P Note (Signed)
History and Physical Interval Note:  I have reviewed his IR films and will start with the lower pole access site.  He is doing well this morning with reduced pain.   09/27/2018 10:42 AM  Jared Serrano  has presented today for surgery, with the diagnosis of LEFT RENAL PELVIC STONE  The various methods of treatment have been discussed with the patient and family. After consideration of risks, benefits and other options for treatment, the patient has consented to  Procedure(s): LEFT NEPHROLITHOTOMY PERCUTANEOUS (Left) as a surgical intervention .  The patient's history has been reviewed, patient examined, no change in status, stable for surgery.  I have reviewed the patient's chart and labs.  Questions were answered to the patient's satisfaction.     [name]

## 2018-09-28 ENCOUNTER — Observation Stay (HOSPITAL_COMMUNITY): Payer: Self-pay

## 2018-09-28 ENCOUNTER — Encounter (HOSPITAL_COMMUNITY): Payer: Self-pay | Admitting: Urology

## 2018-09-28 LAB — HEMOGLOBIN AND HEMATOCRIT, BLOOD
HCT: 44.5 % (ref 39.0–52.0)
Hemoglobin: 14.9 g/dL (ref 13.0–17.0)

## 2018-09-28 NOTE — Progress Notes (Signed)
1 Day Post-Op  Subjective: He is doing well sp Left PCNL.   Hgb is 14.9. He continues to have some pain. ROS:  Review of Systems  All other systems reviewed and are negative.   Anti-infectives: Anti-infectives (From admission, onward)   Start     Dose/Rate Route Frequency Ordered Stop   09/27/18 2000  ciprofloxacin (CIPRO) tablet 500 mg     500 mg Oral 2 times daily 09/27/18 1453     09/27/18 1023  ceFAZolin (ANCEF) IVPB 2g/100 mL premix     2 g 200 mL/hr over 30 Minutes Intravenous 30 min pre-op 09/27/18 1023 09/27/18 1224      Current Facility-Administered Medications  Medication Dose Route Frequency Provider Last Rate Last Dose  . acetaminophen (TYLENOL) tablet 650 mg  650 mg Oral Q4H PRN [name], MD      . bisacodyl (DULCOLAX) suppository 10 mg  10 mg Rectal Daily PRN [name], MD      . ciprofloxacin (CIPRO) tablet 500 mg  500 mg Oral BID [name], MD   500 mg at 09/27/18 2059  . dextrose 5 % and 0.45 % NaCl with KCl 20 mEq/L infusion   Intravenous Continuous [name], MD 100 mL/hr at 09/28/18 0200    . HYDROmorphone (DILAUDID) injection 0.5-1 mg  0.5-1 mg Intravenous Q2H PRN [name], MD   1 mg at 09/28/18 0027  . ondansetron (ZOFRAN) injection 4 mg  4 mg Intravenous Q4H PRN [name], MD   4 mg at 09/27/18 1709  . opium-belladonna (B&O SUPPRETTES) 16.2-60 MG suppository 1 suppository  1 suppository Rectal Q6H PRN [name], MD   1 suppository at 09/28/18 0027  . oxyCODONE (Oxy IR/ROXICODONE) immediate release tablet 5 mg  5 mg Oral Q4H PRN [name], MD   5 mg at 09/28/18 0545  . senna-docusate (Senokot-S) tablet 1 tablet  1 tablet Oral QHS PRN [name], MD      . sodium phosphate (FLEET) 7-19 GM/118ML enema 1 enema  1 enema Rectal Once PRN [name], MD         Objective: Vital signs in last 24 hours: Temp:  [98 F (36.7 C)-98.8 F (37.1 C)] 98.8 F (37.1 C) (10/02 0546) Pulse Rate:  [50-68] 51 (10/02 0546) Resp:  [11-18] 16 (10/02  0546) BP: (137-163)/(67-109) 145/67 (10/02 0546) SpO2:  [94 %-100 %] 94 % (10/02 0546) FiO2 (%):  [2 %] 2 % (10/01 1345)  Intake/Output from previous day: 10/01 0701 - 10/02 0700 In: 3933.1 [I.V.:3833.1; IV Piggyback:100] Out: 4150 [Urine:3950; Blood:200] Intake/Output this shift: No intake/output data recorded.   Physical Exam  Constitutional: He appears well-developed and well-nourished.  Cardiovascular: Normal rate, regular rhythm and normal heart sounds.  Pulmonary/Chest: Effort normal and breath sounds normal. No respiratory distress.  Abdominal: Soft.  Left flank dressing dry.   Vitals reviewed.   Lab Results:  Recent Labs    09/26/18 0830 09/27/18 1510 09/28/18 0436  WBC 8.3  --   --   HGB 17.0 15.5 14.9  HCT 48.4 45.2 44.5  PLT 302  --   --    BMET Recent Labs    09/26/18 0830  NA 143  K 4.1  CL 106  CO2 26  GLUCOSE 99  BUN 27*  CREATININE 1.10  CALCIUM 9.4   PT/INR Recent Labs    09/26/18 0830  LABPROT 12.0  INR 0.89   ABG No results for input(s): PHART, HCO3 in the last 72 hours.  Invalid input(s): PCO2, PO2  Studies/Results: Dg Abd 1 View  Result Date: 09/28/2018 CLINICAL DATA:  Nephrolithiasis. EXAM: ABDOMEN - 1 VIEW COMPARISON:  CT 09/05/2018 FINDINGS: Percutaneous nephrostomy tube on the left. Stent projects in the region of the left ureter. Tiny 3 mm stone in the region of the lower left kidney. No other nephrolithiasis. Air-filled normal caliber colon and small bowel in the left abdomen may be ileus. IMPRESSION: 3 mm stone or stone fragment projecting over the lower left renal bed. Percutaneous nephrostomy tube in place on the left. Additional stent projects over the expected location of the left ureter which is also likely percutaneous. Electronically Signed   By: Narda Rutherford M.D.   On: 09/28/2018 06:38   Dg C-arm 1-60 Min-no Report  Result Date: 09/27/2018 Fluoroscopy was utilized by the requesting physician.  No radiographic  interpretation.   Ir Ureteral Stent Left New Access W/o Sep Nephrostomy Cath  Result Date: 09/26/2018 CLINICAL DATA:  Left nephrolithiasis, preop percutaneous nephrolithotomy EXAM: PERCUTANEOUS NEPHROURETERAL CATHETER PLACEMENT x2 UNDER ULTRASOUND AND FLUOROSCOPIC GUIDANCE FLUOROSCOPY TIME:  2.6 min; 290  uGym2 DAP TECHNIQUE: The procedure, risks (including but not limited to bleeding, infection, organ damage ), benefits, and alternatives were explained to the patient. Questions regarding the procedure were encouraged and answered. The patient understands and consents to the procedure. As antibiotic prophylaxis, cefazolin 2 g was ordered pre-procedure and administered intravenously within one hour of incision. Leftflank region prepped with Betadine, draped in usual sterile fashion, infiltrated locally with 1% lidocaine. Intravenous Fentanyl and Versed were administered as conscious sedation during continuous monitoring of the patient's level of consciousness and physiological / cardiorespiratory status by the radiology RN, with a total moderate sedation time of 24 minutes. Under real-time ultrasound guidance, the upper pole calculus was identified and a 21-gauge micropuncture needle was advanced into its posterior upper pole calyx. Ultrasound image documentation was saved. Urine spontaneously returned through the needle. Needle was exchanged over a guidewire for transitional dilator. Contrast injection confirmed appropriate positioning. 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 Contrast was utilized to opacify the renal collecting system and a small amount of gas injected to demonstrate posterior calices. Under real-time fluoroscopic guidance, a 21 gauge trocar needle was advanced into a posterior lower pole calyx. A 018 guidewire advanced easily passed the stone and down the ureter. Needle was exchanged over a guidewire for transitional  dilator catheter was exchanged over a guidewire for a 5 [place] Kumpe catheter, advanced into the urinary bladder. The upper pole dilator was exchanged over a Bentson wire for a second 5 [place] Kumpe catheter, advanced into the urinary bladder. Radiograph confirms appropriate nephroureteral catheter positioning. Catheters capped and secured externally with 0 silk suture. The patient tolerated the procedure well. COMPLICATIONS: COMPLICATIONS None. IMPRESSION: 1. Technically successful left percutaneous nephroureteral catheter placement x2. 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 The upper pole access additionally allows direct access to the smaller stone in the upper pole calyx. Electronically Signed   By: Corlis Leak M.D.   On: 09/26/2018 10:49   Ir Ureteral Stent Right New Access W/sep Nephrostomy Cath  Result Date: 09/26/2018 CLINICAL DATA:  Left nephrolithiasis, preop percutaneous nephrolithotomy EXAM: PERCUTANEOUS NEPHROURETERAL CATHETER PLACEMENT x2 UNDER ULTRASOUND AND FLUOROSCOPIC GUIDANCE FLUOROSCOPY TIME:  2.6 min; 290  uGym2 DAP TECHNIQUE: The procedure, risks (including but not limited to bleeding,  infection, organ damage ), benefits, and alternatives were explained to the patient. Questions regarding the procedure were encouraged and answered. The patient understands and consents to the procedure. As antibiotic prophylaxis, cefazolin 2 g was ordered pre-procedure and administered intravenously within one hour of incision. Leftflank region prepped with Betadine, draped in usual sterile fashion, infiltrated locally with 1% lidocaine. Intravenous Fentanyl and Versed were administered as conscious sedation during continuous monitoring of the patient's level of consciousness and physiological / cardiorespiratory status by the radiology RN, with a total moderate sedation time of 24 minutes. Under real-time  ultrasound guidance, the upper pole calculus was identified and a 21-gauge micropuncture needle was advanced into its posterior upper pole calyx. Ultrasound image documentation was saved. Urine spontaneously returned through the needle. Needle was exchanged over a guidewire for transitional dilator. Contrast injection confirmed appropriate positioning. 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 Contrast was utilized to opacify the renal collecting system and a small amount of gas injected to demonstrate posterior calices. Under real-time fluoroscopic guidance, a 21 gauge trocar needle was advanced into a posterior lower pole calyx. A 018 guidewire advanced easily passed the stone and down the ureter. Needle was exchanged over a guidewire for transitional dilator catheter was exchanged over a guidewire for a 5 [place] Kumpe catheter, advanced into the urinary bladder. The upper pole dilator was exchanged over a Bentson wire for a second 5 [place] Kumpe catheter, advanced into the urinary bladder. Radiograph confirms appropriate nephroureteral catheter positioning. Catheters capped and secured externally with 0 silk suture. The patient tolerated the procedure well. COMPLICATIONS: COMPLICATIONS None. IMPRESSION: 1. Technically successful left percutaneous nephroureteral catheter placement x2. 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 The upper pole access additionally allows direct access to the smaller stone in the upper pole calyx. Electronically Signed   By: Corlis Leak M.D.   On: 09/26/2018 10:49     Assessment and Plan: He is doing well post left PCNL.   He has a 3mm stone that appears extrarenal.   He will have the tube clamped and removed tomorrow if he tolerates clamping.       LOS: 0 days    [name] 09/28/2018 [phone] ID: Jared Serrano, Jared Serrano   DOB: August 30, 1976, 42 y.o.   MRN: [id number]

## 2018-09-29 ENCOUNTER — Other Ambulatory Visit: Payer: Self-pay | Admitting: Urology

## 2018-09-29 DIAGNOSIS — N200 Calculus of kidney: Secondary | ICD-10-CM

## 2018-09-29 MED ORDER — CIPROFLOXACIN HCL 500 MG PO TABS: 500.0000 mg | ORAL_TABLET | Freq: Two times a day (BID) | ORAL | 0 refills | Status: DC

## 2018-09-29 MED ORDER — OXYCODONE-ACETAMINOPHEN 5-325 MG PO TABS: 1.0000 | ORAL_TABLET | Freq: Four times a day (QID) | ORAL | 0 refills | Status: DC | PRN

## 2018-09-29 NOTE — Discharge Summary (Signed)
Physician Discharge Summary  Patient ID: Quadir Muns MRN: [id number] DOB/AGE: February 03, 1976 42 y.o.  Admit date: 09/26/2018 Discharge date: 09/29/2018  Admission Diagnoses:  Left nephrolithiasis  Discharge Diagnoses:  Principal Problem:   Left nephrolithiasis   Past Medical History:  Diagnosis Date  . Anxiety    no recent trouble with  . Anxiety   . History of kidney stones    passed 2 in age 38's or 26's  . Kidney stone   . UTI (lower urinary tract infection)     Surgeries: Procedure(s): LEFT NEPHROLITHOTOMY PERCUTANEOUS on 09/27/2018   Consultants (if any):   Discharged Condition: Improved  Hospital Course: Johnnathan Hagemeister is an 42 y.o. male who was admitted 09/26/2018 with a diagnosis of Left nephrolithiasis and went to the operating room on 09/27/2018 and underwent the above named procedures.  He had an upper pole access catheter and a lower pole nephrostomy catheter left after the procedure.  The foley was removed on POD#1 and a KUB showed only a 3mm stone that appeared to be along the tract and not intrarenal.  He failed a tube clamping trial this morning.  I removed the upper pole access catheter and redressed the site.  He will be sent home with the nephrostomy tube and have a nephrostogram with tube removal on 10/7.    He was given perioperative antibiotics:  Anti-infectives (From admission, onward)   Start     Dose/Rate Route Frequency Ordered Stop   09/29/18 0000  ciprofloxacin (CIPRO) 500 MG tablet     500 mg Oral 2 times daily 09/29/18 0903     09/27/18 2000  ciprofloxacin (CIPRO) tablet 500 mg     500 mg Oral 2 times daily 09/27/18 1453     09/27/18 1023  ceFAZolin (ANCEF) IVPB 2g/100 mL premix     2 g 200 mL/hr over 30 Minutes Intravenous 30 min pre-op 09/27/18 1023 09/27/18 1224    .  He was given sequential compression devices for DVT prophylaxis.  He benefited maximally from the hospital stay and there were no complications.    Recent vital signs:  Vitals:    09/28/18 2110 09/29/18 0610  BP: 109/70 116/70  Pulse: (!) 55 (!) 59  Resp: 18 20  Temp: 98.3 F (36.8 C) 98.1 F (36.7 C)  SpO2: 97% 98%    Recent laboratory studies:  Lab Results  Component Value Date   HGB 14.9 09/28/2018   HGB 15.5 09/27/2018   HGB 17.0 09/26/2018   Lab Results  Component Value Date   WBC 8.3 09/26/2018   PLT 302 09/26/2018   Lab Results  Component Value Date   INR 0.89 09/26/2018   Lab Results  Component Value Date   NA 143 09/26/2018   K 4.1 09/26/2018   CL 106 09/26/2018   CO2 26 09/26/2018   BUN 27 (H) 09/26/2018   CREATININE 1.10 09/26/2018   GLUCOSE 99 09/26/2018    Discharge Medications:   Allergies as of 09/29/2018   No Known Allergies     Medication List    TAKE these medications   acetaminophen 325 MG tablet Commonly known as:  TYLENOL Take 650 mg by mouth every 6 (six) hours as needed for moderate pain.   ciprofloxacin 500 MG tablet Commonly known as:  CIPRO Take 1 tablet (500 mg total) by mouth 2 (two) times daily. Take the first tab 1 hour prior to your visit to radiology to have the tube removed and the second 12 hours later.  ibuprofen 200 MG tablet Commonly known as:  ADVIL,MOTRIN Take 200 mg by mouth daily as needed (pain).   oxyCODONE-acetaminophen 5-325 MG tablet Commonly known as:  PERCOCET/ROXICET Take 1 tablet by mouth every 6 (six) hours as needed for severe pain.       Diagnostic Studies: Dg Abd 1 View  Result Date: 09/28/2018 CLINICAL DATA:  Nephrolithiasis. EXAM: ABDOMEN - 1 VIEW COMPARISON:  CT 09/05/2018 FINDINGS: Percutaneous nephrostomy tube on the left. Stent projects in the region of the left ureter. Tiny 3 mm stone in the region of the lower left kidney. No other nephrolithiasis. Air-filled normal caliber colon and small bowel in the left abdomen may be ileus. IMPRESSION: 3 mm stone or stone fragment projecting over the lower left renal bed. Percutaneous nephrostomy tube in place on the left.  Additional stent projects over the expected location of the left ureter which is also likely percutaneous. Electronically Signed   By: Narda Rutherford M.D.   On: 09/28/2018 06:38   Dg C-arm 1-60 Min-no Report  Result Date: 09/27/2018 Fluoroscopy was utilized by the requesting physician.  No radiographic interpretation.   Ct Renal Stone Study  Result Date: 09/05/2018 CLINICAL DATA:  42 year old male with intermittent left flank pain since last night and unable to urinate. History of stones. Subsequent encounter. EXAM: CT ABDOMEN AND PELVIS WITHOUT CONTRAST TECHNIQUE: Multidetector CT imaging of the abdomen and pelvis was performed following the standard protocol without IV contrast. COMPARISON:  04/16/2016 CT. FINDINGS: Lower chest: Scarring/atelectasis lung bases. Heart size within normal limits. Hepatobiliary: Enlarged liver spanning over 20.6 cm. Taking into account limitation by non contrast imaging, no worrisome hepatic lesion. No calcified gallstone. Pancreas: Taking into account limitation by non contrast imaging, no worrisome pancreatic mass or inflammation. Spleen: Taking into account limitation by non contrast imaging, no splenic mass or enlargement. Adrenals/Urinary Tract: 1.5 cm obstructing stone left ureteral pelvic junction with moderate left-sided hydronephrosis. Left upper pole scattered tiny nonobstructing renal calculi measuring up to 2.5 mm. Right upper pole tiny nonobstructing renal calculi. Slightly lobulated contour of the kidneys. Taking into account limitation by non contrast imaging, no obvious renal mass. Noncontrast filled views of the urinary bladder without abnormality noted. No adrenal lesion. Stomach/Bowel: No extraluminal bowel inflammatory process. Vascular/Lymphatic: Trace calcified plaque aortic bifurcation and common iliac arteries. No abdominal aortic aneurysm. Scattered normal size lymph nodes. Reproductive: Top-normal size prostate gland with left paracentral coarse  calcification. Other: No free air or bowel containing hernia. Musculoskeletal: No osseous destructive lesion. IMPRESSION: 1. 1.5 cm obstructing stone left ureteral pelvic junction with moderate left-sided hydronephrosis. Left upper pole scattered tiny nonobstructing renal calculi measuring up to 2.5 mm. Right upper pole tiny nonobstructing renal calculi. 2.  Aortic Atherosclerosis (ICD10-I70.0). 3. Top-normal size prostate gland. 4. Elongated liver spanning over 20.6 cm. Electronically Signed   By: Lacy Duverney M.D.   On: 09/05/2018 11:30   Ir Ureteral Stent Left New Access W/o Sep Nephrostomy Cath  Result Date: 09/26/2018 CLINICAL DATA:  Left nephrolithiasis, preop percutaneous nephrolithotomy EXAM: PERCUTANEOUS NEPHROURETERAL CATHETER PLACEMENT x2 UNDER ULTRASOUND AND FLUOROSCOPIC GUIDANCE FLUOROSCOPY TIME:  2.6 min; 290  uGym2 DAP TECHNIQUE: The procedure, risks (including but not limited to bleeding, infection, organ damage ), benefits, and alternatives were explained to the patient. Questions regarding the procedure were encouraged and answered. The patient understands and consents to the procedure. As antibiotic prophylaxis, cefazolin 2 g was ordered pre-procedure and administered intravenously within one hour of incision. Leftflank region prepped with Betadine, draped in  usual sterile fashion, infiltrated locally with 1% lidocaine. Intravenous Fentanyl and Versed were administered as conscious sedation during continuous monitoring of the patient's level of consciousness and physiological / cardiorespiratory status by the radiology RN, with a total moderate sedation time of 24 minutes. Under real-time ultrasound guidance, the upper pole calculus was identified and a 21-gauge micropuncture needle was advanced into its posterior upper pole calyx. Ultrasound image documentation was saved. Urine spontaneously returned through the needle. Needle was exchanged over a guidewire for transitional dilator. Contrast  injection confirmed appropriate positioning. 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 Contrast was utilized to opacify the renal collecting system and a small amount of gas injected to demonstrate posterior calices. Under real-time fluoroscopic guidance, a 21 gauge trocar needle was advanced into a posterior lower pole calyx. A 018 guidewire advanced easily passed the stone and down the ureter. Needle was exchanged over a guidewire for transitional dilator catheter was exchanged over a guidewire for a 5 [place] Kumpe catheter, advanced into the urinary bladder. The upper pole dilator was exchanged over a Bentson wire for a second 5 [place] Kumpe catheter, advanced into the urinary bladder. Radiograph confirms appropriate nephroureteral catheter positioning. Catheters capped and secured externally with 0 silk suture. The patient tolerated the procedure well. COMPLICATIONS: COMPLICATIONS None. IMPRESSION: 1. Technically successful left percutaneous nephroureteral catheter placement x2. 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 The upper pole access additionally allows direct access to the smaller stone in the upper pole calyx. Electronically Signed   By: Corlis Leak M.D.   On: 09/26/2018 10:49   Ir Ureteral Stent Right New Access W/sep Nephrostomy Cath  Result Date: 09/26/2018 CLINICAL DATA:  Left nephrolithiasis, preop percutaneous nephrolithotomy EXAM: PERCUTANEOUS NEPHROURETERAL CATHETER PLACEMENT x2 UNDER ULTRASOUND AND FLUOROSCOPIC GUIDANCE FLUOROSCOPY TIME:  2.6 min; 290  uGym2 DAP TECHNIQUE: The procedure, risks (including but not limited to bleeding, infection, organ damage ), benefits, and alternatives were explained to the patient. Questions regarding the procedure were encouraged and answered. The patient understands and consents  to the procedure. As antibiotic prophylaxis, cefazolin 2 g was ordered pre-procedure and administered intravenously within one hour of incision. Leftflank region prepped with Betadine, draped in usual sterile fashion, infiltrated locally with 1% lidocaine. Intravenous Fentanyl and Versed were administered as conscious sedation during continuous monitoring of the patient's level of consciousness and physiological / cardiorespiratory status by the radiology RN, with a total moderate sedation time of 24 minutes. Under real-time ultrasound guidance, the upper pole calculus was identified and a 21-gauge micropuncture needle was advanced into its posterior upper pole calyx. Ultrasound image documentation was saved. Urine spontaneously returned through the needle. Needle was exchanged over a guidewire for transitional dilator. Contrast injection confirmed appropriate positioning. 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 Contrast was utilized to opacify the renal collecting system and a small amount of gas injected to demonstrate posterior calices. Under real-time fluoroscopic guidance, a 21 gauge trocar needle was advanced into a posterior lower pole calyx. A 018 guidewire advanced easily passed the stone and down the ureter. Needle was exchanged over a guidewire for transitional dilator catheter was exchanged over a guidewire for a 5 [place] Kumpe catheter, advanced into the urinary bladder. The upper pole dilator was exchanged over a Bentson wire for a second 5 [place] Kumpe  catheter, advanced into the urinary bladder. Radiograph confirms appropriate nephroureteral catheter positioning. Catheters capped and secured externally with 0 silk suture. The patient tolerated the procedure well. COMPLICATIONS: COMPLICATIONS None. IMPRESSION: 1. Technically successful left percutaneous nephroureteral catheter placement x2. 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 The upper pole access additionally allows direct access to the smaller stone in the upper pole calyx. Electronically Signed   By: Corlis Leak M.D.   On: 09/26/2018 10:49    Disposition: Discharge disposition: 01-Home or Self Care       Discharge Instructions    Discontinue IV   Complete by:  As directed    Urinary leg bag   Complete by:  As directed       Follow-up Information    Burman Foster, NP Follow up on 10/06/2018.   Specialty:  Urology Why:  945am Contact information: [address] [place] [address] [phone]            Signed: [name] 09/29/2018, 9:18 AM

## 2018-09-29 NOTE — Discharge Instructions (Signed)
Percutaneous Nephrostomy, Care After  This sheet gives you information about how to care for yourself after your procedure. Your health care provider may also give you more specific instructions. If you have problems or questions, contact your health care provider.  What can I expect after the procedure?  After the procedure, it is common to have:  · Some soreness where the nephrostomy tube was inserted (tube insertion site).  · Blood-tinged drainage from the nephrostomy tube for the first 24 hours.    Follow these instructions at home:  Activity  · Return to your normal activities as told by your health care provider. Ask your health care provider what activities are safe for you.  · Avoid activities that may cause the nephrostomy tubing to bend.  · Do not take baths, swim, or use a hot tub until your health care provider approves. Ask your health care provider if you can take showers. Cover the nephrostomy tube dressing with a watertight covering when you take a shower.  · Do not drive for 24 hours if you were given a medicine to help you relax (sedative).  Care of the tube insertion site  · Follow instructions from your health care provider about how to take care of your tube insertion site. Make sure you:  ? Wash your hands with soap and water before you change your bandage (dressing). If soap and water are not available, use hand sanitizer.  ? Change your dressing as told by your health care provider. Be careful not to pull on the tube while removing the dressing.  ? When you change the dressing, wash the skin around the tube, rinse well, and pat the skin dry.  · Check the tube insertion area every day for signs of infection. Check for:  ? More redness, swelling, or pain.  ? More fluid or blood.  ? Warmth.  ? Pus or a bad smell.  Care of the nephrostomy tube and drainage bag  · Always keep the tubing, the leg bag, or the bedside drainage bags below the level of the kidney so that your urine drains  freely.  · When connecting your nephrostomy tube to a drainage bag, make sure that there are no kinks in the tubing and that your urine is draining freely. You may want to use an elastic bandage to wrap any exposed tubing that goes from the nephrostomy tube to any of the connecting tubes.  · At night, you may want to connect your nephrostomy tube or the leg bag to a larger bedside drainage bag.  · Follow instructions from your health care provider about how to empty or change the drainage bag.  · Empty the drainage bag when it becomes ? full.  · Replace the drainage bag and any extension tubing that is connected to your nephrostomy tube every 3 weeks or as often as told by your health care provider. Your health care provider will explain how to change the drainage bag and extension tubing.  General instructions  · Take over-the-counter and prescription medicines only as told by your health care provider.  · Keep all follow-up visits as told by your health care provider. This is important.  Contact a health care provider if:  · You have problems with any of the valves or tubing.  · You have persistent pain or soreness in your back.  · You have more redness, swelling, or pain around your tube insertion site.  · You have more fluid or blood coming from   your tube insertion site.  · Your tube insertion site feels warm to the touch.  · You have pus or a bad smell coming from your tube insertion site.  · You have increased urine output or you feel burning when urinating.  Get help right away if:  · You have pain in your abdomen during the first week.  · You have chest pain or have trouble breathing.  · You have a new appearance of blood in your urine.  · You have a fever or chills.  · You have back pain that is not relieved by your medicine.  · You have decreased urine output.  · Your nephrostomy tube comes out.  This information is not intended to replace advice given to you by your health care provider. Make sure you  discuss any questions you have with your health care provider.  Document Released: 08/06/2004 Document Revised: 09/25/2016 Document Reviewed: 09/25/2016  [employer]vier Interactive Patient Education © 2018 [employer]

## 2018-09-29 NOTE — Progress Notes (Signed)
At 0715, RN entered room to do bedside rounding and found patient in a fetal position on his right side grimacing in pain. He stated Dr. Annabell Howells left ~10 minutes prior and he had plugged the tube.  RN hooked patient back up to the standard drainage bag which yielded pink urine and also noted a moderate size clot pass through the tubing. Patient immediately felt relief. Will continue to monitor.

## 2018-09-29 NOTE — Progress Notes (Signed)
Changed patient's standard drainage bag from nephrostomy to the leg bag and showed patient and mother how to do so. Patient's mother did a return demonstration. Reviewed nephrostomy care at home sheet with patient.  Explained signs and symptoms of infection and when to call the doctor.  Supplies given to patient. No further questions at this time.  Prescriptions given, follow up appointment in place and patient is ready for d/c.

## 2018-10-03 ENCOUNTER — Other Ambulatory Visit (HOSPITAL_COMMUNITY): Payer: Self-pay

## 2018-10-03 ENCOUNTER — Encounter (HOSPITAL_COMMUNITY): Payer: Self-pay | Admitting: Interventional Radiology

## 2018-10-03 ENCOUNTER — Ambulatory Visit (HOSPITAL_COMMUNITY)
Admission: RE | Admit: 2018-10-03 | Discharge: 2018-10-03 | Disposition: A | Discharge status: Home / Self Care | Payer: Self-pay | Source: Ambulatory Visit | Attending: Urology | Admitting: Urology

## 2018-10-03 DIAGNOSIS — Z87442 Personal history of urinary calculi: Secondary | ICD-10-CM | POA: Insufficient documentation

## 2018-10-03 DIAGNOSIS — N200 Calculus of kidney: Secondary | ICD-10-CM

## 2018-10-03 DIAGNOSIS — Z436 Encounter for attention to other artificial openings of urinary tract: Secondary | ICD-10-CM | POA: Insufficient documentation

## 2018-10-03 HISTORY — PX: IR NEPHROSTOGRAM LEFT THRU EXISTING ACCESS: IMG6061

## 2018-10-03 MED ORDER — IOPAMIDOL (ISOVUE-300) INJECTION 61%
INTRAVENOUS | Status: AC
  Administered 2018-10-03: 15 mL
  Filled 2018-10-03: qty 50

## 2018-10-03 MED ORDER — IOPAMIDOL (ISOVUE-300) INJECTION 61%
50.0000 mL | Freq: Once | INTRAVENOUS | Status: AC | PRN
  Administered 2018-10-03: 15 mL

## 2018-10-03 NOTE — Procedures (Signed)
Pre Procedure Dx: Nephrolithiasis Post Procedural Dx: Same  Appropriately positioned and functioning PCNL access. No evidence of urinary obstruction or residual stone burden. Successful uncomplicated bedside removal of PCNL access.  EBL: None Complications: None immediate.  Katherina Right, MD Pager #: [phone]

## 2019-02-05 ENCOUNTER — Emergency Department (HOSPITAL_COMMUNITY): Payer: Self-pay

## 2019-02-05 ENCOUNTER — Emergency Department (HOSPITAL_COMMUNITY)
Admission: EM | Admit: 2019-02-05 | Discharge: 2019-02-05 | Disposition: A | Discharge status: Home / Self Care | Payer: Self-pay | Attending: Emergency Medicine | Admitting: Emergency Medicine

## 2019-02-05 ENCOUNTER — Encounter (HOSPITAL_COMMUNITY): Payer: Self-pay

## 2019-02-05 ENCOUNTER — Other Ambulatory Visit: Payer: Self-pay

## 2019-02-05 DIAGNOSIS — F17210 Nicotine dependence, cigarettes, uncomplicated: Secondary | ICD-10-CM | POA: Insufficient documentation

## 2019-02-05 DIAGNOSIS — M7022 Olecranon bursitis, left elbow: Secondary | ICD-10-CM | POA: Insufficient documentation

## 2019-02-05 DIAGNOSIS — Y9389 Activity, other specified: Secondary | ICD-10-CM | POA: Insufficient documentation

## 2019-02-05 MED ORDER — MELOXICAM 15 MG PO TABS: 15.0000 mg | ORAL_TABLET | Freq: Every day | ORAL | 0 refills | Status: DC

## 2019-02-05 NOTE — ED Provider Notes (Signed)
[facility]-EMERGENCY DEPT Provider Note   CSN: [id number] Arrival date & time: 02/05/19  0102     History   Chief Complaint Chief Complaint  Patient presents with  . Joint Swelling    HPI Jared Serrano is a 43 y.o. male with a PMHx of anxiety and kidney stones, who presents to the ED with complaints of left elbow pain and swelling that began last week.  Patient states that 2 weeks ago he was trimming bushes and felt a pop in his left elbow, he had some soreness that gradually went away so he did not think anything of it.  Then last week he again was trimming bushes and felt another pop, and after that he started having pain and swelling of the left elbow.  He describes the pain is 6/10 constant sharp left elbow pain that radiates into the upper arm, worse with palpation to the area, and unrelieved with ibuprofen and ice.  He denies any erythema, warmth, bruising, abrasions or cuts to the area, fevers, numbness, tingling, focal weakness, or any other complaints at this time.  The history is provided by the patient and medical records. No language interpreter was used.    Past Medical History:  Diagnosis Date  . Anxiety    no recent trouble with  . Anxiety   . History of kidney stones    passed 2 in age 17's or 54's  . Kidney stone   . UTI (lower urinary tract infection)     Patient Active Problem List   Diagnosis Date Noted  . Nephrolithiasis 09/26/2018  . Left nephrolithiasis 09/05/2018  . Kidney stone 09/05/2018    Past Surgical History:  Procedure Laterality Date  . IR NEPHROSTOGRAM LEFT THRU EXISTING ACCESS  10/03/2018  . IR URETERAL STENT LEFT NEW ACCESS W/O SEP NEPHROSTOMY CATH  09/26/2018  . IR URETERAL STENT RIGHT NEW ACCESS W/SEP NEPHROSTOMY CATH  09/26/2018  . NEPHROLITHOTOMY Left 09/27/2018   Procedure: LEFT NEPHROLITHOTOMY PERCUTANEOUS;  Surgeon: [name], MD;  Location: [facility];  Service: Urology;  Laterality: Left;  . NO PAST SURGERIES    .  WISDOM TOOTH EXTRACTION          Home Medications    Prior to Admission medications   Medication Sig Start Date End Date Taking? Authorizing Provider  acetaminophen (TYLENOL) 325 MG tablet Take 650 mg by mouth every 6 (six) hours as needed for moderate pain.    [provider]  ciprofloxacin (CIPRO) 500 MG tablet Take 1 tablet (500 mg total) by mouth 2 (two) times daily. Take the first tab 1 hour prior to your visit to radiology to have the tube removed and the second 12 hours later. 09/29/18   [name], MD  ibuprofen (ADVIL,MOTRIN) 200 MG tablet Take 200 mg by mouth daily as needed (pain).    [provider]  oxyCODONE-acetaminophen (PERCOCET/ROXICET) 5-325 MG tablet Take 1 tablet by mouth every 6 (six) hours as needed for severe pain. 09/29/18   [name], MD    Family History Family History  Problem Relation Age of Onset  . Diabetes Mother   . Neuropathy Mother   . Urolithiasis Father     Social History Social History   Tobacco Use  . Smoking status: Current Every Day Smoker    Packs/day: 1.00    Years: 20.00    Pack years: 20.00    Types: Cigarettes  . Smokeless tobacco: Never Used  Substance Use Topics  . Alcohol use:  Yes  . Drug use: Yes    Types: Marijuana    Comment: Last used: 2 days ago      Allergies   Patient has no known allergies.   Review of Systems Review of Systems  Constitutional: Negative for chills and fever.  Musculoskeletal: Positive for arthralgias and joint swelling.  Skin: Negative for color change and wound.  Allergic/Immunologic: Negative for immunocompromised state.  Neurological: Negative for weakness and numbness.     Physical Exam Updated Vital Signs BP 133/90 (BP Location: Left Arm)   Pulse 90   Temp 97.9 F (36.6 C) (Oral)   Resp 16   Ht 5\' 2"  (1.575 m)   Wt 72.6 kg   SpO2 96%   BMI 29.26 kg/m   Physical Exam Vitals signs and nursing note reviewed.  Constitutional:      General: He is not in  acute distress.    Appearance: Normal appearance. He is well-developed. He is not toxic-appearing.     Comments: Afebrile, nontoxic, NAD  HENT:     Head: Normocephalic and atraumatic.  Eyes:     General:        Right eye: No discharge.        Left eye: No discharge.     Conjunctiva/sclera: Conjunctivae normal.  Neck:     Musculoskeletal: Normal range of motion and neck supple.  Cardiovascular:     Rate and Rhythm: Normal rate.     Pulses: Normal pulses.  Pulmonary:     Effort: Pulmonary effort is normal. No respiratory distress.  Abdominal:     General: There is no distension.  Musculoskeletal: Normal range of motion.     Left elbow: He exhibits swelling. He exhibits normal range of motion, no effusion, no deformity and no laceration. Tenderness found.     Comments: L elbow with FROM intact, moderate swelling to the olecranon bursa but no effusion, mild TTP over the olecranon bursa and to the medial joint/epicondyle, no other tenderness to remainder of forearm or upper arm. No overlying erythema or warmth, no overlying skin wounds. No bruising. Strength and sensation grossly intact, distal pulses intact, compartments soft.   Skin:    General: Skin is warm and dry.     Findings: No rash.  Neurological:     Mental Status: He is alert and oriented to person, place, and time.     Sensory: Sensation is intact. No sensory deficit.     Motor: Motor function is intact.  Psychiatric:        Mood and Affect: Mood and affect normal.        Behavior: Behavior normal.      ED Treatments / Results  Labs (all labs ordered are listed, but only abnormal results are displayed) Labs Reviewed - No data to display  EKG None  Radiology Dg Elbow Complete Left  Result Date: 02/05/2019 CLINICAL DATA:  43 year old male with a history of left elbow swelling EXAM: LEFT ELBOW - COMPLETE 3+ VIEW COMPARISON:  None. FINDINGS: No acute displaced fracture. Lateral view demonstrates soft tissue swelling  overlying the olecranon. No joint effusion. No significant degenerative changes. No radiopaque foreign body. IMPRESSION: No acute bony abnormality. Soft tissue swelling overlying the olecranon, nonspecific though potentially reactive/inflammatory such as bursitis. Electronically Signed   By: Gilmer MorJaime  Wagner D.O.   On: 02/05/2019 10:54    Procedures Procedures (including critical care time)  Medications Ordered in ED Medications - No data to display   Initial Impression / Assessment  and Plan / ED Course  I have reviewed the triage vital signs and the nursing notes.  Pertinent labs & imaging results that were available during my care of the patient were reviewed by me and considered in my medical decision making (see chart for details).     44 y.o. male here with what appears to be olecranon bursitis.  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  On exam, moderate swelling to the olecranon bursa, mild tenderness, some tenderness to the medial joint, but no effusion appreciated.  No overlying erythema or warmth, no overlying skin wounds.  Doubt septic joint or gout, likely repetitive strain injury resulting in olecranon bursitis.  Doubt need for aspiration at this time.  Xray confirming olecranon bursitis, no other bony abnormality. Will place an Ace wrap, discussed avoiding repetitive movements, protecting the elbow, will start on Mobic, advised Tylenol and ice use, and follow-up with orthopedist in 1 week. I explained the diagnosis and have given explicit precautions to return to the ER including for any other new or worsening symptoms. The patient understands and accepts the medical plan as it's been dictated and I have answered their questions. Discharge instructions concerning home care and prescriptions have been given. The patient is STABLE and is discharged to home in good  condition.    Final Clinical Impressions(s) / ED Diagnoses   Final diagnoses:  Olecranon bursitis of left elbow    ED Discharge Orders         Ordered    meloxicam (MOBIC) 15 MG tablet  Daily     02/05/19 [address] [place], [place] 02/05/19 1240    Samuel Jester, [place] 02/06/19 1510

## 2019-02-05 NOTE — Discharge Instructions (Signed)
Use Ace wrap for protection and compression of the elbow.  Try avoid doing repetitive movements with your elbow.  Take Mobic as directed, but do not combine this with any other NSAIDs like ibuprofen, Aleve, etc.  You may take additional Tylenol for additional pain relief.  Ice and elevate the elbow to help with pain and swelling.  Follow-up with the orthopedist in the next week for reassessment and ongoing management of your elbow issue.  Return to the emergency room for any changes or worsening symptoms.

## 2019-02-05 NOTE — ED Triage Notes (Signed)
Pt states he has had swelling in his left elbow for 2 weeks. Pt states he was trimming bushes when it started. Pt is able to move arm but has pain.

## 2019-02-08 ENCOUNTER — Encounter (INDEPENDENT_AMBULATORY_CARE_PROVIDER_SITE_OTHER): Payer: Self-pay | Admitting: Physician Assistant

## 2019-02-08 ENCOUNTER — Ambulatory Visit (INDEPENDENT_AMBULATORY_CARE_PROVIDER_SITE_OTHER): Payer: Self-pay | Admitting: Physician Assistant

## 2019-02-08 DIAGNOSIS — M7022 Olecranon bursitis, left elbow: Secondary | ICD-10-CM

## 2019-02-08 DIAGNOSIS — M25512 Pain in left shoulder: Secondary | ICD-10-CM

## 2019-02-08 MED ORDER — LIDOCAINE HCL 1 % IJ SOLN
3.0000 mL | INTRAMUSCULAR | Status: AC | PRN
  Administered 2019-02-08: 3 mL

## 2019-02-08 MED ORDER — METHYLPREDNISOLONE ACETATE 40 MG/ML IJ SUSP
40.0000 mg | INTRAMUSCULAR | Status: AC | PRN
  Administered 2019-02-08: 40 mg via INTRA_ARTICULAR

## 2019-02-08 MED ORDER — LIDOCAINE HCL 1 % IJ SOLN
0.5000 mL | INTRAMUSCULAR | Status: AC | PRN
  Administered 2019-02-08: .5 mL

## 2019-02-08 MED ORDER — LIDOCAINE HCL 1 % IJ SOLN
2.0000 mL | INTRAMUSCULAR | Status: AC | PRN
  Administered 2019-02-08: 2 mL

## 2019-02-08 NOTE — Progress Notes (Signed)
         Patient: Jared Serrano             Date of Birth: 1976/12/11           MRN: [id number]             Visit Date: 02/08/2019  HPI: Jared Serrano is a 43 year old male comes in today with left elbow pain and left shoulder pain.  He reports left elbow and shoulder pain for over 2 weeks now.  He states he was working cutting some bushes felt something pop round about his shoulder and drop the electric hedge shears that he was using.  He has had swelling posterior aspect of his elbow.  He has had pain about the left shoulder girdle.  Denies any numbness tingling down the arm.  He has been wrapping the elbow with an Ace wrap.  He has sharp pain within the shoulder with any lifting.  Has been taking Tylenol and meloxicam for pain.  Denies any fevers chills.  Radiographs of his left elbow 3 views are reviewed these are dated 02/05/2019 and on coned system.  Left shoulder no acute fracture.  Soft tissue swelling is noted over the olecranon consistent with a bursitis.  Review of systems see HPI otherwise negative  Physical exam: General well-developed well-nourished male no acute distress mood and affect appropriate. Psych: Alert and oriented x3. Vascular: Radial pulses are 2+ bilaterally and equal and symmetric Bilateral shoulders he has 5 out of 5 strength with internal rotation against resistance.  5 out of 5 strength with external rotation against resistance on the right 4 out of 5 on the left.  Positive impingement testing left negative on the right.  He has tenderness at the proximal biceps and into the bicipital groove.  No appreciable ecchymosis or distalization of the biceps muscle body on the left compared to the right.  Distal biceps tendon is nontender and intact.  Left elbow olecranon bursitis present without significant erythema or warmth.  He has good range of motion the elbow.  Tenderness over the medial epicondyle of the left elbow. Procedures: Visit Diagnoses: Acute pain of left  shoulder  Olecranon bursitis of left elbow  Large Joint Inj: L subacromial bursa on 02/08/2019 4:20 PM Indications: pain Details: 22 G 1.5 in needle, lateral approach  Arthrogram: No  Medications: 3 mL lidocaine 1 %; 40 mg methylPREDNISolone acetate 40 MG/ML; 0.5 mL lidocaine 1 % Outcome: tolerated well, no immediate complications Procedure, treatment alternatives, risks and benefits explained, specific risks discussed. Consent was given by the patient. Immediately prior to procedure a time out was called to verify the correct patient, procedure, equipment, support staff and site/side marked as required. Patient was prepped and draped in the usual sterile fashion.   Medium Joint Inj: L olecranon bursa on 02/08/2019 4:21 PM Medications: 2 mL lidocaine 1 %; 40 mg methylPREDNISolone acetate 40 MG/ML Aspirate: 4 mL yellow Outcome: tolerated well, no immediate complications    Plan: He is given Codman, wall crawls, pendulum and forward flexion exercises for the shoulder.  Regards to the elbow the elbow is wrapped with Ace bandages leave this on until this evening.  He is to keep pressure off the elbow.  See him back in 2 weeks check his response to the injections and to the shoulder exercises.  Questions encouraged and answered at length.  His shoulder pain continues we will obtain 3 views views of his left shoulder upon return.

## 2019-02-22 ENCOUNTER — Ambulatory Visit (INDEPENDENT_AMBULATORY_CARE_PROVIDER_SITE_OTHER): Payer: Self-pay | Admitting: Physician Assistant

## 2019-02-22 ENCOUNTER — Ambulatory Visit (INDEPENDENT_AMBULATORY_CARE_PROVIDER_SITE_OTHER): Payer: Self-pay

## 2019-02-22 ENCOUNTER — Encounter (INDEPENDENT_AMBULATORY_CARE_PROVIDER_SITE_OTHER): Payer: Self-pay | Admitting: Physician Assistant

## 2019-02-22 VITALS — Ht 62.0 in | Wt 160.0 lb

## 2019-02-22 DIAGNOSIS — M25512 Pain in left shoulder: Secondary | ICD-10-CM

## 2019-02-22 NOTE — Progress Notes (Signed)
Office Visit Note   Patient: Jared Serrano           Date of Birth: 06-27-1976           MRN: [id number] Visit Date: 02/22/2019              Requested by: No referring provider defined for this encounter. PCP: Patient, No Pcp Per   Assessment & Plan: Visit Diagnoses:  1. Acute pain of left shoulder     Plan: Due to the fact he continues to have pain despite conservative efforts with cortisone injection in the left shoulder hand exercise along with time recommend MRI to rule out rotator cuff tear.  See him back after the MRI of the left shoulder. He is to keep all pressure off of his left elbow. Follow-Up Instructions: Return for after MRI.   Orders:  Orders Placed This Encounter  Procedures  . XR Shoulder Left   No orders of the defined types were placed in this encounter.     Procedures: No procedures performed   Clinical Data: No additional findings.   Subjective: Chief Complaint  Patient presents with  . Left Elbow - Follow-up  . Left Shoulder - Follow-up    HPI Jared Serrano returns today for follow-up of his left shoulder pain and left olecranon bursitis.  He states that the left shoulder injection helped for only a couple of days and now his pain is back to the point it was prior to the shot.  Elbow slightly improved.  He is having no radicular symptoms down the left arm. Review of Systems Please see HPI otherwise negative  Objective: Vital Signs: Ht 5\' 2"  (1.575 m)   Wt 160 lb (72.6 kg)   BMI 29.26 kg/m   Physical Exam Constitutional:      Appearance: He is not ill-appearing or diaphoretic.  Pulmonary:     Effort: Pulmonary effort is normal.  Neurological:     Mental Status: He is alert and oriented to person, place, and time.  Psychiatric:        Mood and Affect: Mood normal.     Ortho Exam Left elbow no drainage.  No erythema and no abnormal warmth no edema.  Minimal tenderness with palpation.  Good range of motion the elbow otherwise without  pain. Left shoulder positive impingement sign.  Is tenderness over the deltoid region.  No tenderness over the biceps or bicipital groove.  Weakness and pain with empty can test on the left.  Out of 5 strength external/internal rotation against resistance liftoff test on the left is positive negative on the right. Specialty Comments:  No specialty comments available.  Imaging: Xr Shoulder Left  Result Date: 02/22/2019 Left shoulder 3 views: Shoulders well located.  No acute fractures.  Glenohumeral joint is well-maintained.  Subacromial space well maintained.    PMFS History: Patient Active Problem List   Diagnosis Date Noted  . Nephrolithiasis 09/26/2018  . Left nephrolithiasis 09/05/2018  . Kidney stone 09/05/2018   Past Medical History:  Diagnosis Date  . Anxiety    no recent trouble with  . Anxiety   . History of kidney stones    passed 2 in age 34's or 20's  . Kidney stone   . UTI (lower urinary tract infection)     Family History  Problem Relation Age of Onset  . Diabetes Mother   . Neuropathy Mother   . Urolithiasis Father     Past Surgical History:  Procedure Laterality Date  .  IR NEPHROSTOGRAM LEFT THRU EXISTING ACCESS  10/03/2018  . IR URETERAL STENT LEFT NEW ACCESS W/O SEP NEPHROSTOMY CATH  09/26/2018  . IR URETERAL STENT RIGHT NEW ACCESS W/SEP NEPHROSTOMY CATH  09/26/2018  . NEPHROLITHOTOMY Left 09/27/2018   Procedure: LEFT NEPHROLITHOTOMY PERCUTANEOUS;  Surgeon: [name], MD;  Location: [facility];  Service: Urology;  Laterality: Left;  . NO PAST SURGERIES    . WISDOM TOOTH EXTRACTION     Social History   Occupational History  . Not on file  Tobacco Use  . Smoking status: Current Every Day Smoker    Packs/day: 1.00    Years: 20.00    Pack years: 20.00    Types: Cigarettes  . Smokeless tobacco: Never Used  Substance and Sexual Activity  . Alcohol use: Yes  . Drug use: Yes    Types: Marijuana    Comment: Last used: 2 days ago   . Sexual activity: Not  on file

## 2019-02-23 ENCOUNTER — Other Ambulatory Visit (INDEPENDENT_AMBULATORY_CARE_PROVIDER_SITE_OTHER): Payer: Self-pay

## 2019-02-23 DIAGNOSIS — G8929 Other chronic pain: Secondary | ICD-10-CM

## 2019-02-23 DIAGNOSIS — M25512 Pain in left shoulder: Principal | ICD-10-CM

## 2019-03-03 ENCOUNTER — Ambulatory Visit (HOSPITAL_COMMUNITY)
Admission: RE | Admit: 2019-03-03 | Discharge: 2019-03-03 | Disposition: A | Discharge status: Home / Self Care | Payer: Self-pay | Source: Ambulatory Visit | Attending: Orthopaedic Surgery | Admitting: Orthopaedic Surgery

## 2019-03-03 DIAGNOSIS — G8929 Other chronic pain: Secondary | ICD-10-CM

## 2019-03-03 DIAGNOSIS — M25512 Pain in left shoulder: Secondary | ICD-10-CM | POA: Insufficient documentation

## 2019-03-08 ENCOUNTER — Other Ambulatory Visit: Payer: Self-pay

## 2019-03-08 ENCOUNTER — Encounter (INDEPENDENT_AMBULATORY_CARE_PROVIDER_SITE_OTHER): Payer: Self-pay | Admitting: Orthopaedic Surgery

## 2019-03-08 ENCOUNTER — Ambulatory Visit (INDEPENDENT_AMBULATORY_CARE_PROVIDER_SITE_OTHER): Payer: Self-pay | Admitting: Orthopaedic Surgery

## 2019-03-08 DIAGNOSIS — M25512 Pain in left shoulder: Secondary | ICD-10-CM

## 2019-03-08 NOTE — Progress Notes (Signed)
The patient is continue to follow-up for his left shoulder.  He had a work-related accident when he felt a pop in his shoulder.  After the failure of conservative treatment and a steroid injection was sent for an MRI.  This is really been going on for about a month now.  He is modified his activities at work so is not doing a lot with his left shoulder.  He points to the lateral aspect of his shoulder on the deltoid and some anterior as a source of his pain.  On exam his range of motion is full and his rotator cuff feels strong but is deftly painful still with the shoulder.  MRI is reviewed with him and the only thing that stands on the MRI is a little bit of signal change in the superior labrum suggesting a possible tear but is not really indicative of it but certainly he may have something going on from that standpoint.  The rotator cuff itself is completely intact.  There is no atrophy of the muscles.  There is no tendinosis of the rotator cuff or tendinitis.  The biceps tendon is intact.  The cartilage is intact.  There is no effusion or no fracture.  At this point I gave him some reassurance that I would think he needs any type of surgery on his shoulder but we do want a watch this closely and reevaluate him in 4 weeks to see if he is having continued symptoms.  I do think an arthroscopic intervention is warranted as of now but certainly if that becomes an issue we may have to consider having either my partner Dr. August Saucer take a look at him to see what he benefit from an arthroscopic intervention which would include a debridement of the labrum and a possible biceps tenodesis.  We will see how is doing in 4 weeks from now.

## 2019-04-06 ENCOUNTER — Other Ambulatory Visit: Payer: Self-pay

## 2019-04-06 ENCOUNTER — Ambulatory Visit (INDEPENDENT_AMBULATORY_CARE_PROVIDER_SITE_OTHER): Payer: Self-pay | Admitting: Orthopaedic Surgery

## 2019-04-06 ENCOUNTER — Encounter (INDEPENDENT_AMBULATORY_CARE_PROVIDER_SITE_OTHER): Payer: Self-pay | Admitting: Orthopaedic Surgery

## 2019-04-06 DIAGNOSIS — M25512 Pain in left shoulder: Secondary | ICD-10-CM

## 2019-04-06 NOTE — Progress Notes (Signed)
HPI: Jared Serrano returns today follow-up of his left shoulder.  He continues to have pain deep within the shoulder.  He states is constant achy pain.  He denies any dislocation/subluxation of the shoulder.  Given the MRI of the shoulder showed no rotator cuff tear.  No tendinosis of the rotator cuff.  Biceps tendon was intact.  Only finding was a signal change was suggestive of a possible superior labral tear.  Physical exam: Rotator cuff strength 5 out of 5 bilaterally.  Full range of motion of the shoulder but pain with overhead activity.  Impingement testing causes significant left shoulder pain.  Impression: Acute left shoulder pain  Plan: Due to the fact the COVID-19 pandemic we are unable to perform any type of elective surgery, would recommend intra-articular injection of the shoulder.  We will have him see Dr. Prince Rome on Monday and have a left shoulder intra-articular injection.  Follow-up with Dr. Magnus Ivan in 1 month check his progress.  Questions encouraged and answered.

## 2019-04-10 ENCOUNTER — Ambulatory Visit (INDEPENDENT_AMBULATORY_CARE_PROVIDER_SITE_OTHER): Payer: Self-pay | Admitting: Family Medicine

## 2019-04-10 ENCOUNTER — Encounter (INDEPENDENT_AMBULATORY_CARE_PROVIDER_SITE_OTHER): Payer: Self-pay | Admitting: Family Medicine

## 2019-04-10 ENCOUNTER — Other Ambulatory Visit: Payer: Self-pay

## 2019-04-10 DIAGNOSIS — M25512 Pain in left shoulder: Secondary | ICD-10-CM

## 2019-04-10 MED ORDER — METHYLPREDNISOLONE ACETATE 40 MG/ML IJ SUSP: 80.0000 mg | Freq: Once | INTRAMUSCULAR | Status: DC

## 2019-04-10 NOTE — Progress Notes (Signed)
Office Visit Note   Patient: Jared Serrano           Date of Birth: [birth year]           MRN: [id number] Visit Date: 04/10/2019 Requested by: No referring provider defined for this encounter. PCP: Patient, No Pcp Per  Subjective: Chief Complaint  Patient presents with  . Left Shoulder - Pain    HPI: He is here for left shoulder injection.  Symptoms for 2 months since noticing a pop while trimming a bush.  Recent MRI scan showed possible superior labrum tear.  He states that his shoulder still pops with certain movements, but he does not feel pain when it pops.  His main areas of pain are in the posterior shoulder and on top of the shoulder.  These pains are fairly constant.              ROS: No fevers or chills or respiratory symptoms.  All other systems were reviewed and are negative.  Objective: Vital Signs: There were no vitals taken for this visit.  Physical Exam:  General:  Alert and oriented, in no acute distress. Pulm:  Breathing unlabored. Psy:  Normal mood, congruent affect. Skin: No rash on his skin. Left shoulder: He has a symptomatic trigger point in the left deltoid region posteriorly that seems to reproduce most of his pain.  There is another area on top of his shoulder just posterior to the [facility] joint that seems to reproduce the other pain.  He still has full range of motion of his shoulder with minimal discomfort on isometric rotator cuff strength testing.  Imaging: None today.  Assessment & Plan: 1.  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  If this does not eliminate symptoms, could inject the glenohumeral joint.  After a few days he will start doing rotator cuff strengthening exercises for maintenance.     Procedures: Left shoulder injections: After  sterile prep with Betadine, injected 3 cc lidocaine and 40 mg methylprednisolone superiorly just posterior to the [facility] joint, and 5 cc 1% lidocaine without epinephrine and 40 mg methylprednisolone into the posterior deltoid area trigger point.  He had excellent pain relief during the anesthetic phase.    PMFS History: Patient Active Problem List   Diagnosis Date Noted  . Nephrolithiasis 09/26/2018  . Left nephrolithiasis 09/05/2018  . Kidney stone 09/05/2018   Past Medical History:  Diagnosis Date  . Anxiety    no recent trouble with  . Anxiety   . History of kidney stones    passed 2 in age 21's or 5's  . Kidney stone   . UTI (lower urinary tract infection)     Family History  Problem Relation Age of Onset  . Diabetes Mother   . Neuropathy Mother   . Urolithiasis Father     Past Surgical History:  Procedure Laterality Date  . IR NEPHROSTOGRAM LEFT THRU EXISTING ACCESS  10/03/2018  . IR URETERAL STENT LEFT NEW ACCESS W/O SEP NEPHROSTOMY CATH  09/26/2018  . IR URETERAL STENT RIGHT NEW ACCESS W/SEP NEPHROSTOMY CATH  09/26/2018  . NEPHROLITHOTOMY Left 09/27/2018   Procedure: LEFT NEPHROLITHOTOMY PERCUTANEOUS;  Surgeon: [name], MD;  Location: [facility];  Service: Urology;  Laterality: Left;  . NO PAST SURGERIES    . WISDOM  TOOTH EXTRACTION     Social History   Occupational History  . Not on file  Tobacco Use  . Smoking status: Current Every Day Smoker    Packs/day: 1.00    Years: 20.00    Pack years: 20.00    Types: Cigarettes  . Smokeless tobacco: Never Used  Substance and Sexual Activity  . Alcohol use: Yes  . Drug use: Yes    Types: Marijuana    Comment: Last used: 2 days ago   . Sexual activity: Not on file

## 2019-09-07 IMAGING — XA IR NEPHROSTOGRAM EXISTING ACCESS LEFT
1 series · 3 of 3 positions shown · non-contrast
Comparison: Image guided left-sided nephrolithotomy
access-09/26/2018;

INDICATION: History of left-sided nephrolithiasis post image guided placement of
2 separate accesses for nephrolithotomy on 09/26/2018.

Patient presents today for antegrade nephrostogram via existing
surgical access catheter prior to potential removal.
EXAM:
ANTEGRADE NEPHROSTOGRAM VIA EXISTING LEFT-SIDED NEPHROLITHOTOMY
ACCESS
TECHNIQUE: Patient was positioned prone on the fluoroscopy table.

[Series 300: tube placements · 3 of 3 slices shown]
[im 1/3]
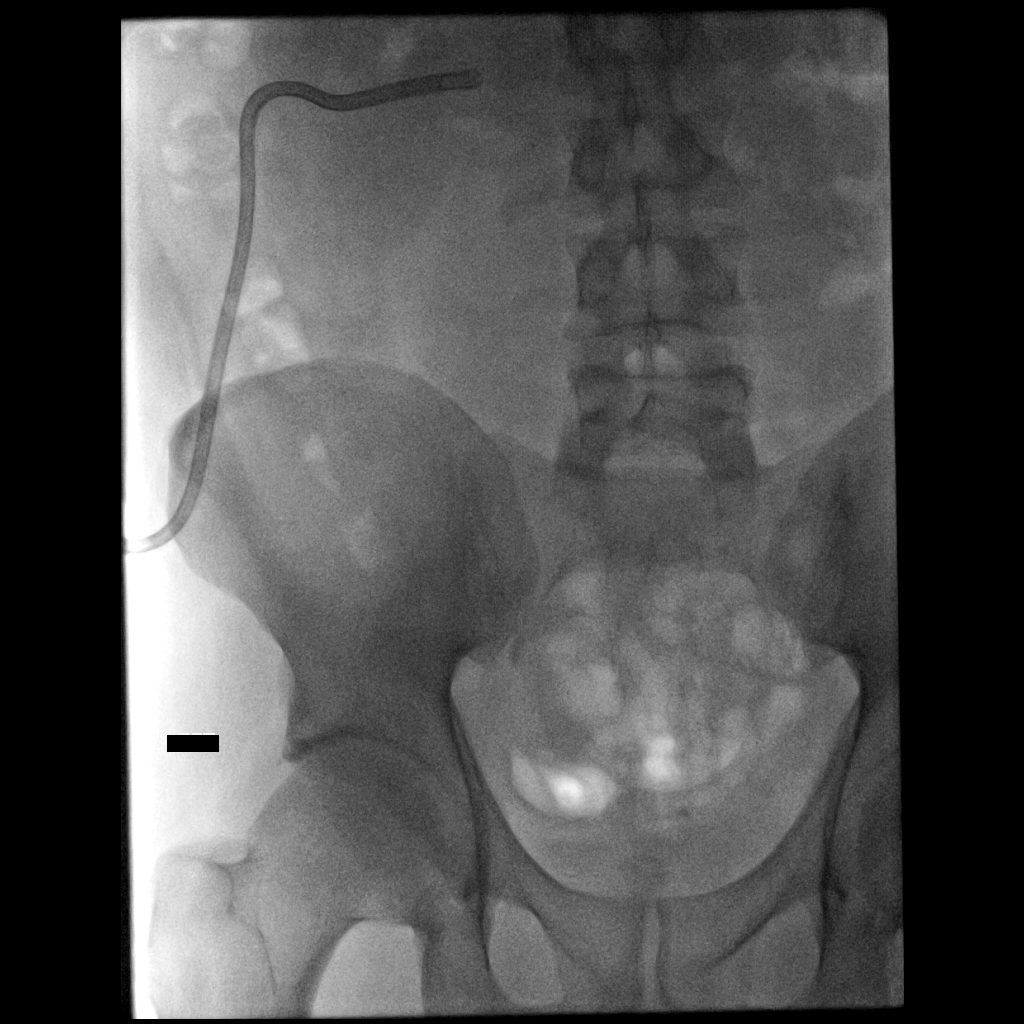
[im 2/3]
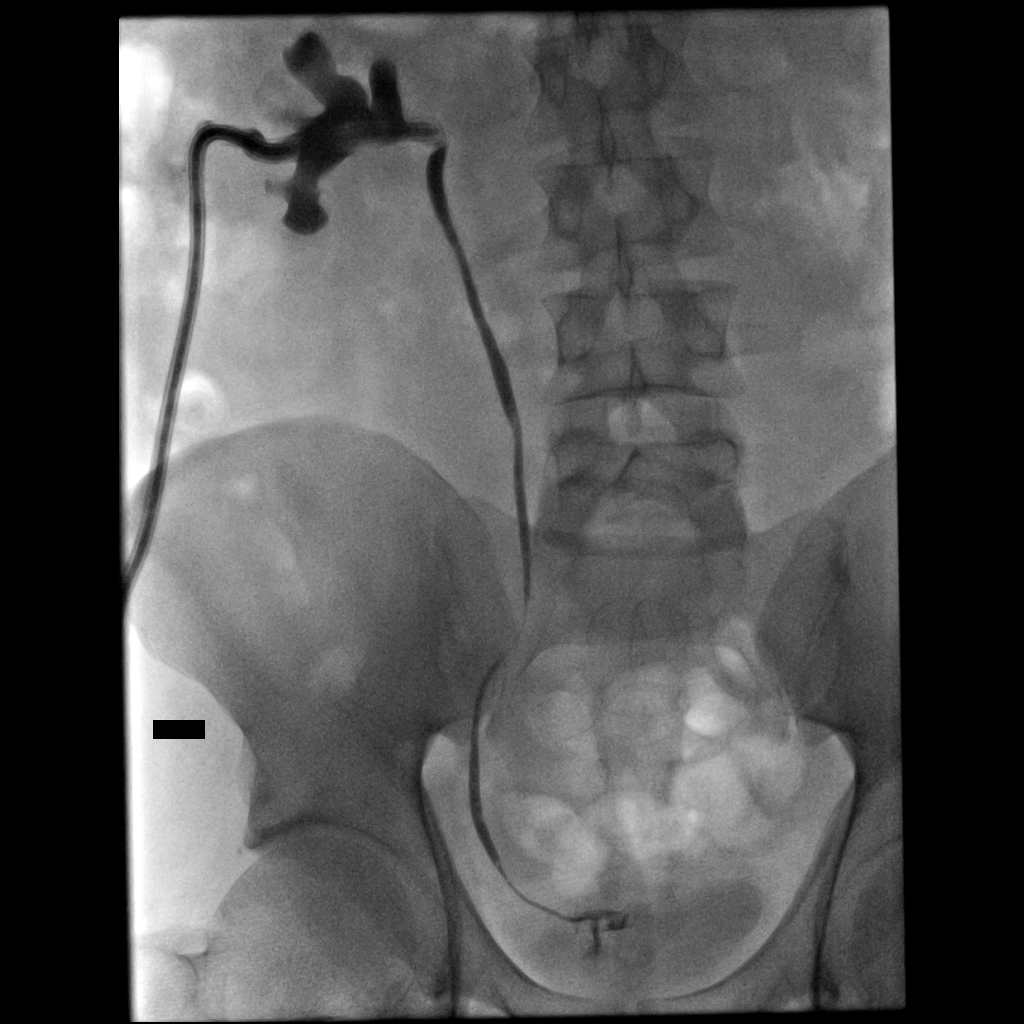
[im 3/3]
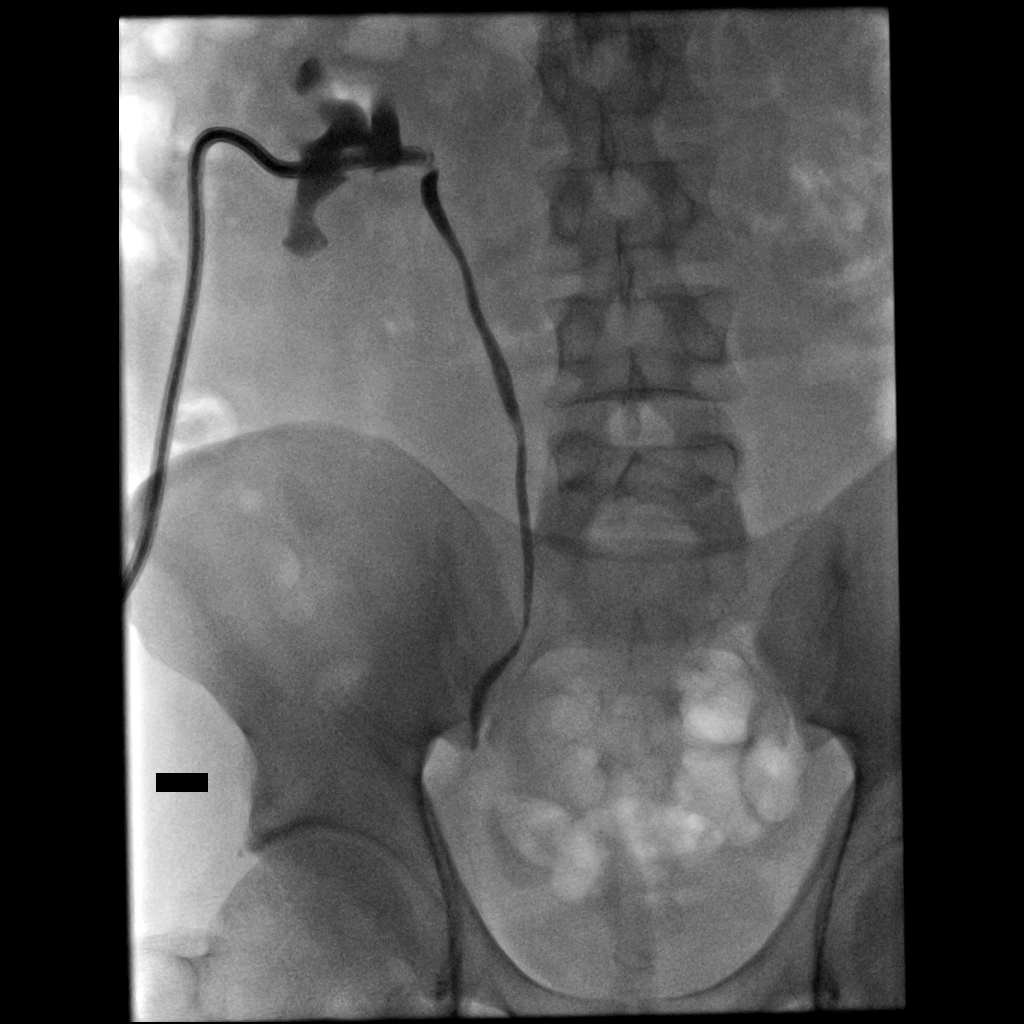

[3 of 3 positions shown; findings below may reference images not displayed]

CT abdomen pelvis - 09/05/2018

CONTRAST:  20 cc Bsovue-YAA administered into the collecting system

FLUOROSCOPY TIME:  18 seconds (2 mGy)

COMPLICATIONS:
None immediate.
Preprocedural spot fluoroscopic image was obtained of the left flank

Multiple spot fluoroscopic and radiographic images were obtained
from the injection of a small amount contrast via the existing
nephrolithotomy access

Images were reviewed and discussed with Dr. Manoher and the decision
was made to remove the nephrolithotomy access

The external portion of the access was cut, the suture was removed
and the nephrostomy was removed intact.

A dressing was placed. The patient tolerated the procedure well
without immediate postprocedural complication.
FINDINGS: Prevertebral spot fluoroscopic image demonstrates unchanged
positioning of the nephrolithotomy access with end overlying
expected location of the left renal pelvis.

Contrast injection demonstrates appropriate positioning and
functionality of the nephrolithotomy access catheter with brisk
passage of contrast through the left ureter to the level of the
urinary bladder. There is no evidence of left-sided
pelvicaliectasis. There are no discrete persistent filling defects
to suggest residual nephrolithiasis.

As such, the nephrolithotomy access was removed at the patient's
bedside without incident.
IMPRESSION: 1. No evidence of pelvicaliectasis, urinary obstruction or residual
stone burden following nephrolithotomy.
2. Successful bedside removal of existing nephrolithotomy access.

Above discussed with Dr. Manoher prior to nephrolithotomy access
removal.

## 2019-09-18 ENCOUNTER — Encounter: Payer: Self-pay | Admitting: Family Medicine

## 2019-09-18 ENCOUNTER — Other Ambulatory Visit: Payer: Self-pay

## 2019-09-18 ENCOUNTER — Ambulatory Visit (INDEPENDENT_AMBULATORY_CARE_PROVIDER_SITE_OTHER): Payer: Self-pay | Admitting: Family Medicine

## 2019-09-18 DIAGNOSIS — R29898 Other symptoms and signs involving the musculoskeletal system: Secondary | ICD-10-CM

## 2019-09-18 DIAGNOSIS — R200 Anesthesia of skin: Secondary | ICD-10-CM

## 2019-09-18 MED ORDER — GABAPENTIN 100 MG PO CAPS: ORAL_CAPSULE | ORAL | 3 refills | Status: AC

## 2019-09-18 NOTE — Progress Notes (Signed)
Office Visit Note   Patient: Jared Serrano           Date of Birth: 03/20/1976           MRN: [id number] Visit Date: 09/18/2019 Requested by: No referring provider defined for this encounter. PCP: Patient, No Pcp Per  Subjective: Chief Complaint  Patient presents with   Left Shoulder - Pain, Follow-up    Persistent left shoulder pain. Now has numbness in the left hand/lack of strength. Right - hand dominant. Cortisone injection 04/10/19 did help with ROM, but shoulder continues to ache. Pain medial elbow.    HPI: He is here with left arm numbness and weakness.  Original onset of symptoms was around February.  Subacromial injection in the shoulder did not help much so an MRI was obtained showing possible superior labrum tear.  He saw me in April and I did a trigger point injection and an AC joint injection.  He had some improvement in pain but not much.  He has been trying to live with his symptoms but in the past few weeks he has developed numbness in the arm into the hand, and weakness with grip and with wrist extension.  He denies any neck pain with this.  His shoulder still hurts on the posterior aspect.              ROS: No fevers or chills.  All other systems were reviewed and are negative.  Objective: Vital Signs: There were no vitals taken for this visit.  Physical Exam:  General:  Alert and oriented, in no acute distress. Pulm:  Breathing unlabored. Psy:  Normal mood, congruent affect. Skin: No erythema or rash. Right shoulder: He still has full range of motion, pain reaching overhead.  Tender near the long head biceps tendon and tenderness in the posterior subacromial space.  No palpable crepitus in the shoulder with active range of motion.  Rotator cuff strength is 5/5.  No muscular atrophy visible. Neck: Full range of motion with negative Spurling's test.  Biceps, triceps strength are normal.  He has weakness with wrist extension on the left and with interosseous testing.   Negative Tinel's at the ulnar groove and at the carpal tunnel, negative Phalen's test.  Imaging: None today.  Assessment & Plan: 1.  Chronic right shoulder pain, now with arm numbness and weakness, etiology uncertain.  Question Parsonage-Turner syndrome, cervical radiculopathy, etc. -Gabapentin at night as needed.  Referral to Dr. Ernestina Patches for nerve conduction studies.     Procedures: No procedures performed  No notes on file     PMFS History: Patient Active Problem List   Diagnosis Date Noted   Nephrolithiasis 09/26/2018   Left nephrolithiasis 09/05/2018   Kidney stone 09/05/2018   Past Medical History:  Diagnosis Date   Anxiety    no recent trouble with   Anxiety    History of kidney stones    passed 2 in age 50's or 80's   Kidney stone    UTI (lower urinary tract infection)     Family History  Problem Relation Age of Onset   Diabetes Mother    Neuropathy Mother    Urolithiasis Father     Past Surgical History:  Procedure Laterality Date   IR NEPHROSTOGRAM LEFT THRU EXISTING ACCESS  10/03/2018   IR URETERAL STENT LEFT NEW ACCESS W/O SEP NEPHROSTOMY CATH  09/26/2018   IR URETERAL STENT RIGHT NEW ACCESS W/SEP NEPHROSTOMY CATH  09/26/2018   NEPHROLITHOTOMY Left 09/27/2018  Procedure: LEFT NEPHROLITHOTOMY PERCUTANEOUS;  Surgeon: [name], MD;  Location: [facility];  Service: Urology;  Laterality: Left;   NO PAST SURGERIES     WISDOM TOOTH EXTRACTION     Social History   Occupational History   Not on file  Tobacco Use   Smoking status: Current Every Day Smoker    Packs/day: 1.00    Years: 20.00    Pack years: 20.00    Types: Cigarettes   Smokeless tobacco: Never Used  Substance and Sexual Activity   Alcohol use: Yes   Drug use: Yes    Types: Marijuana    Comment: Last used: 2 days ago    Sexual activity: Not on file

## 2019-10-11 ENCOUNTER — Other Ambulatory Visit: Payer: Self-pay

## 2019-10-11 ENCOUNTER — Ambulatory Visit (INDEPENDENT_AMBULATORY_CARE_PROVIDER_SITE_OTHER): Payer: Self-pay | Admitting: Physical Medicine and Rehabilitation

## 2019-10-11 ENCOUNTER — Encounter: Payer: Self-pay | Admitting: Physical Medicine and Rehabilitation

## 2019-10-11 DIAGNOSIS — R202 Paresthesia of skin: Secondary | ICD-10-CM

## 2019-10-11 NOTE — Progress Notes (Signed)
  Numeric Pain Rating Scale and Functional Assessment Average Pain 5   In the last MONTH (on 0-10 scale) has pain interfered with the following?  1. General activity like being  able to carry out your everyday physical activities such as walking, climbing stairs, carrying groceries, or moving a chair?  Rating(10 

## 2019-10-12 NOTE — Procedures (Signed)
EMG & NCV Findings: Evaluation of the left median motor nerve showed prolonged distal onset latency (4.3 ms) and decreased conduction velocity (Elbow-Wrist, 45 m/s).  The left median (across palm) sensory nerve showed prolonged distal peak latency (Wrist, 4.7 ms) and prolonged distal peak latency (Palm, 2.8 ms).  The left ulnar sensory nerve showed prolonged distal peak latency (4.2 ms) and decreased conduction velocity (Wrist-5th Digit, 33 m/s).  All remaining nerves (as indicated in the following tables) were within normal limits.    Needle evaluation of the left Ext Digitorum muscle showed increased insertional activity, increased spontaneous activity, and diminished recruitment.  All remaining muscles (as indicated in the following table) showed no evidence of electrical instability.    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   **There is also evidence of denervation particularly in the extensor digitorum comm(EDC) muscle but without denervation in other muscles with similar dermatomes or radial nerve innervation.  This could represent potentially a C7 radiculopathy versus a problem with the posterior interosseous nerve.     There is no significant electrodiagnostic evidence of any other focal nerve entrapment, brachial plexopathy or generalized peripheral neuropathy.   Recommendations: 1.  Follow-up with referring physician. 2.  Continue current management of symptoms. 3.  Suggest 1) possible carpal tunnel injection diagnostically 2) MRI of cervical spine if felt radicular 3) ultrasound evaluation of the course of the posterior interosseous nerve and if symptoms are persistent repeat electrodiagnostic study with more specific evaluation of the radial nerve.   ___________________________ Wonda Olds Board Certified, American Board of Physical Medicine and Rehabilitation    Nerve Conduction Studies Anti Sensory Summary Table   Stim Site NR Peak (ms) Norm Peak (ms) P-T Amp (V) Norm P-T Amp Site1 Site2 Delta-P (ms) Dist (cm) Vel (m/s) Norm Vel (m/s)  Left Median Acr Palm Anti Sensory (2nd Digit)  31.4C  Wrist    *4.7 <3.6 33.9 >10 Wrist Palm 1.9 0.0    Palm    *2.8 <2.0 33.1         Left Radial Anti Sensory (Base 1st Digit)  33.5C  Wrist    2.8 <3.1 24.0  Wrist Base 1st Digit 2.8 0.0    Left Ulnar Anti Sensory (5th Digit)  31.7C  Wrist    *4.2 <3.7 29.8 >15.0 Wrist 5th Digit 4.2 14.0 *33 >38   Motor Summary Table   Stim Site NR Onset (ms) Norm Onset (ms) O-P Amp (mV) Norm O-P Amp Site1 Site2 Delta-0 (ms) Dist (cm) Vel (m/s) Norm Vel (m/s)  Left Median Motor (Abd Poll Brev)  33C  Wrist    *4.3 <4.2 5.2 >5 Elbow Wrist 5.1 23.0 *45 >50  Elbow    9.4  9.0         Left Ulnar Motor (Abd Dig Min)  32C  Wrist    3.7 <4.2 9.3 >3 B Elbow Wrist 4.0 21.0 53 >53  B Elbow    7.7  9.5  A Elbow B Elbow 1.6 9.5 59 >53  A Elbow    9.3  9.0          EMG   Side Muscle Nerve Root Ins Act Fibs Psw Amp Dur Poly Recrt Int Fraser Din Comment  Left 1stDorInt Ulnar C8-T1 Nml Nml Nml Nml Nml 0 Nml Nml   Left Abd [place]  Brev Median C8-T1 Nml Nml Nml Nml Nml 0 Nml Nml   Left Triceps Radial C6-7-8 Nml Nml Nml Nml Nml 0 Nml Nml   Left Deltoid Axillary C5-6 Nml Nml Nml Nml Nml 0 Nml Nml   Left Ext Digitorum  Radial (Post Int) C7-8 *Incr *3+ *3+ Nml Nml 0 *Reduced Nml     Nerve Conduction Studies Anti Sensory Left/Right Comparison   Stim Site L Lat (ms) R Lat (ms) L-R Lat (ms) L Amp (V) R Amp (V) L-R Amp (%) Site1 Site2 L Vel (m/s) R Vel (m/s) L-R Vel (m/s)  Median Acr Palm Anti Sensory (2nd Digit)  31.4C  Wrist *4.7   33.9   Wrist Palm     Palm *2.8   33.1         Radial Anti Sensory (Base 1st Digit)  33.5C  Wrist 2.8   24.0   Wrist Base 1st Digit      Ulnar Anti Sensory (5th Digit)  31.7C  Wrist *4.2   29.8   Wrist 5th Digit *33     Motor Left/Right Comparison   Stim Site L Lat (ms) R Lat (ms) L-R Lat (ms) L Amp (mV) R Amp (mV) L-R Amp (%) Site1 Site2 L Vel (m/s) R Vel (m/s) L-R Vel (m/s)  Median Motor (Abd Poll Brev)  33C  Wrist *4.3   5.2   Elbow Wrist *45    Elbow 9.4   9.0         Ulnar Motor (Abd Dig Min)  32C  Wrist 3.7   9.3   B Elbow Wrist 53    B Elbow 7.7   9.5   A Elbow B Elbow 59    A Elbow 9.3   9.0            Waveforms:

## 2019-10-12 NOTE — Progress Notes (Signed)
Jared Serrano - 43 y.o. male MRN [id number]  Date of birth: [birth year]  Office Visit Note: Visit Date: 10/11/2019 PCP: Patient, No Pcp Per Referred by: Lavada Mesi, MD  Subjective: Chief Complaint  Patient presents with  . Left Arm - Pain   HPI: Jared Serrano is a 43 y.o. male who comes in today At the request of Dr. Doneen Poisson for electrodiagnostic study of the left upper limb.  Patient is right-hand dominant reports injury in early February where he was trimming bushes and felt a pop in his arm and since that time has had persistent left shoulder pain.  Initially saw Rexene Edison, PA-C and Dr. Doneen Poisson in our office.  MRI of the shoulder showed possible labral tear but no other findings.  He had intra-articular injection by Dr. Jorge Mandril with mild change in symptoms.  He reported Dr. Jorge Ny getting some numbness and tingling in the left hand.  Today he reports limited movement in the left wrist and he has pain in the left elbow upper arm and shoulder.  He says his hand sometimes feels stiff and swollen more than what he would consider numbness.  Pain is 5 out of 5.  No right-sided complaints.  Patient has had no prior cervical surgery.  He does have some neck pain at times.  This is on the left.  ROS Otherwise per HPI.  Assessment & Plan: Visit Diagnoses:  1. 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   **There is also evidence of denervation particularly in the extensor digitorum comm(EDC) muscle but without denervation in other muscles with similar dermatomes or radial nerve innervation.  This could represent potentially a C7 radiculopathy versus a problem with the posterior interosseous nerve.     There is no significant electrodiagnostic  evidence of any other focal nerve entrapment, brachial plexopathy or generalized peripheral neuropathy.   Recommendations: 1.  Follow-up with referring physician. 2.  Continue current management of symptoms. 3.  Suggest 1) possible carpal tunnel injection diagnostically 2) MRI of cervical spine if felt radicular 3) ultrasound evaluation of the course of the posterior interosseous nerve and if symptoms are persistent repeat electrodiagnostic study with more specific evaluation of the radial nerve.  Meds & Orders: No orders of the defined types were placed in this encounter.   Orders Placed This Encounter  Procedures  . NCV with EMG (electromyography)    Follow-up: Return for Lavada Mesi, MD.   Procedures: No procedures performed  EMG & NCV Findings: Evaluation of the left median motor nerve showed prolonged distal onset latency (4.3 ms) and decreased conduction velocity (Elbow-Wrist, 45 m/s).  The left median (across palm) sensory nerve showed prolonged distal peak latency (Wrist, 4.7 ms) and prolonged distal peak latency (Palm, 2.8 ms).  The left ulnar sensory nerve showed prolonged distal peak latency (4.2 ms) and decreased conduction velocity (Wrist-5th Digit, 33 m/s).  All remaining nerves (as indicated in the following tables) were within normal limits.    Needle evaluation of the left Ext Digitorum muscle showed increased insertional activity, increased spontaneous activity, and diminished recruitment.  All remaining muscles (as indicated in the following table) showed no evidence of electrical instability.    Impression: The above electrodiagnostic study is ABNORMAL and reveals evidence of a moderate left  median nerve entrapment at the wrist (clinical correlation with carpal tunnel syndrome would be paramount in this case) affecting sensory and motor components.   **There is also evidence of denervation particularly in the extensor digitorum comm(EDC) muscle but without denervation  in other muscles with similar dermatomes or radial nerve innervation.  This could represent potentially a C7 radiculopathy versus a problem with the posterior interosseous nerve.     There is no significant electrodiagnostic evidence of any other focal nerve entrapment, brachial plexopathy or generalized peripheral neuropathy.   Recommendations: 1.  Follow-up with referring physician. 2.  Continue current management of symptoms. 3.  Suggest 1) possible carpal tunnel injection diagnostically 2) MRI of cervical spine if felt radicular 3) ultrasound evaluation of the course of the posterior interosseous nerve and if symptoms are persistent repeat electrodiagnostic study with more specific evaluation of the radial nerve.  ___________________________ Wonda Olds Board Certified, American Board of Physical Medicine and Rehabilitation    Nerve Conduction Studies Anti Sensory Summary Table   Stim Site NR Peak (ms) Norm Peak (ms) P-T Amp (V) Norm P-T Amp Site1 Site2 Delta-P (ms) Dist (cm) Vel (m/s) Norm Vel (m/s)  Left Median Acr Palm Anti Sensory (2nd Digit)  31.4C  Wrist    *4.7 <3.6 33.9 >10 Wrist Palm 1.9 0.0    Palm    *2.8 <2.0 33.1         Left Radial Anti Sensory (Base 1st Digit)  33.5C  Wrist    2.8 <3.1 24.0  Wrist Base 1st Digit 2.8 0.0    Left Ulnar Anti Sensory (5th Digit)  31.7C  Wrist    *4.2 <3.7 29.8 >15.0 Wrist 5th Digit 4.2 14.0 *33 >38   Motor Summary Table   Stim Site NR Onset (ms) Norm Onset (ms) O-P Amp (mV) Norm O-P Amp Site1 Site2 Delta-0 (ms) Dist (cm) Vel (m/s) Norm Vel (m/s)  Left Median Motor (Abd Poll Brev)  33C  Wrist    *4.3 <4.2 5.2 >5 Elbow Wrist 5.1 23.0 *45 >50  Elbow    9.4  9.0         Left Ulnar Motor (Abd Dig Min)  32C  Wrist    3.7 <4.2 9.3 >3 B Elbow Wrist 4.0 21.0 53 >53  B Elbow    7.7  9.5  A Elbow B Elbow 1.6 9.5 59 >53  A Elbow    9.3  9.0          EMG   Side Muscle Nerve Root Ins Act Fibs Psw Amp Dur Poly Recrt Int Fraser Din  Comment  Left 1stDorInt Ulnar C8-T1 Nml Nml Nml Nml Nml 0 Nml Nml   Left Abd Poll Brev Median C8-T1 Nml Nml Nml Nml Nml 0 Nml Nml   Left Triceps Radial C6-7-8 Nml Nml Nml Nml Nml 0 Nml Nml   Left Deltoid Axillary C5-6 Nml Nml Nml Nml Nml 0 Nml Nml   Left Ext Digitorum  Radial (Post Int) C7-8 *Incr *3+ *3+ Nml Nml 0 *Reduced Nml     Nerve Conduction Studies Anti Sensory Left/Right Comparison   Stim Site L Lat (ms) R Lat (ms) L-R Lat (ms) L Amp (V) R Amp (V) L-R Amp (%) Site1 Site2 L Vel (m/s) R Vel (m/s) L-R Vel (m/s)  Median Acr Palm Anti Sensory (2nd Digit)  31.4C  Wrist *4.7   33.9   Wrist Palm     Palm *2.8   33.1  Radial Anti Sensory (Base 1st Digit)  33.5C  Wrist 2.8   24.0   Wrist Base 1st Digit     Ulnar Anti Sensory (5th Digit)  31.7C  Wrist *4.2   29.8   Wrist 5th Digit *33     Motor Left/Right Comparison   Stim Site L Lat (ms) R Lat (ms) L-R Lat (ms) L Amp (mV) R Amp (mV) L-R Amp (%) Site1 Site2 L Vel (m/s) R Vel (m/s) L-R Vel (m/s)  Median Motor (Abd Poll Brev)  33C  Wrist *4.3   5.2   Elbow Wrist *45    Elbow 9.4   9.0         Ulnar Motor (Abd Dig Min)  32C  Wrist 3.7   9.3   B Elbow Wrist 53    B Elbow 7.7   9.5   A Elbow B Elbow 59    A Elbow 9.3   9.0            Waveforms:            Clinical History: No specialty comments available.   He reports that he has been smoking cigarettes. He has a 20.00 pack-year smoking history. He has never used smokeless tobacco. No results for input(s): HGBA1C, LABURIC in the last 8760 hours.  Objective:  VS:  HT:    WT:   BMI:     BP:   HR: bpm  TEMP: ( )  RESP:  Physical Exam Musculoskeletal:        General: No tenderness.     Comments: Inspection reveals no atrophy of the bilateral APB or FDI or hand intrinsics. There is no swelling, color changes, allodynia or dystrophic changes. There is 5 out of 5 strength in the bilateral wrist extension, finger abduction and long finger flexion. There is intact  sensation to light touch in all dermatomal and peripheral nerve distributions.  There is a negative Hoffmann's test bilaterally.  He does seem to have shoulder pain with external rotation.  Skin:    General: Skin is warm and dry.     Findings: No erythema or rash.  Neurological:     General: No focal deficit present.     Mental Status: He is alert and oriented to person, place, and time.     Sensory: No sensory deficit.     Motor: No weakness or abnormal muscle tone.     Coordination: Coordination normal.     Gait: Gait normal.  Psychiatric:        Mood and Affect: Mood normal.        Behavior: Behavior normal.        Thought Content: Thought content normal.     Ortho Exam Imaging: No results found.  Past Medical/Family/Surgical/Social History: Medications & Allergies reviewed per EMR, new medications updated. Patient Active Problem List   Diagnosis Date Noted  . Nephrolithiasis 09/26/2018  . Left nephrolithiasis 09/05/2018  . Kidney stone 09/05/2018   Past Medical History:  Diagnosis Date  . Anxiety    no recent trouble with  . Anxiety   . History of kidney stones    passed 2 in age 43's or 4730's  . Kidney stone   . UTI (lower urinary tract infection)    Family History  Problem Relation Age of Onset  . Diabetes Mother   . Neuropathy Mother   . Urolithiasis Father    Past Surgical History:  Procedure Laterality Date  . IR NEPHROSTOGRAM LEFT THRU  EXISTING ACCESS  10/03/2018  . IR URETERAL STENT LEFT NEW ACCESS W/O SEP NEPHROSTOMY CATH  09/26/2018  . IR URETERAL STENT RIGHT NEW ACCESS W/SEP NEPHROSTOMY CATH  09/26/2018  . NEPHROLITHOTOMY Left 09/27/2018   Procedure: LEFT NEPHROLITHOTOMY PERCUTANEOUS;  Surgeon: [name], MD;  Location: [facility];  Service: Urology;  Laterality: Left;  . NO PAST SURGERIES    . WISDOM TOOTH EXTRACTION     Social History   Occupational History  . Not on file  Tobacco Use  . Smoking status: Current Every Day Smoker    Packs/day: 1.00     Years: 20.00    Pack years: 20.00    Types: Cigarettes  . Smokeless tobacco: Never Used  Substance and Sexual Activity  . Alcohol use: Yes  . Drug use: Yes    Types: Marijuana    Comment: Last used: 2 days ago   . Sexual activity: Not on file

## 2019-10-13 ENCOUNTER — Other Ambulatory Visit: Payer: Self-pay

## 2019-10-13 ENCOUNTER — Encounter: Payer: Self-pay | Admitting: Family Medicine

## 2019-10-13 ENCOUNTER — Ambulatory Visit (INDEPENDENT_AMBULATORY_CARE_PROVIDER_SITE_OTHER): Payer: Self-pay | Admitting: Family Medicine

## 2019-10-13 DIAGNOSIS — M542 Cervicalgia: Secondary | ICD-10-CM

## 2019-10-13 DIAGNOSIS — R200 Anesthesia of skin: Secondary | ICD-10-CM

## 2019-10-13 NOTE — Progress Notes (Signed)
Followup left upper extremity nerve studies.

## 2019-10-13 NOTE — Progress Notes (Signed)
   Office Visit Note   Patient: Jared Serrano           Date of Birth: 1976/03/18           MRN: [id number] Visit Date: 10/13/2019 Requested by: No referring provider defined for this encounter. PCP: Patient, No Pcp Per  Subjective: Chief Complaint  Patient presents with  . Left Arm - Follow-up    HPI: He is here to discuss nerve study results.  He continues to have pain in the left shoulder, constant pain with additional pain in the lateral elbow and a numbness and tingling on the dorsum of his hand and fingers.  He has no numbness on the palm side of his hand.  Nerve studies show moderate carpal tunnel syndrome and evidence for possible C7 radiculopathy versus radial tunnel syndrome.              ROS:   All other systems were reviewed and are negative.  Objective: Vital Signs: There were no vitals taken for this visit.  Physical Exam:  General:  Alert and oriented, in no acute distress. Pulm:  Breathing unlabored. Psy:  Normal mood, congruent affect. Skin: No rash or bruising. Neck: He does have an equivocal Spurling's test.  Tender near the left of C6-7.  Elbow is tender near the radial tunnel.  Negative Tinel's at the carpal tunnel and negative Phalen's test.  Imaging: None today.  Assessment & Plan: 1.  Persistent left arm pain with dorsal hand numbness, question cervical radiculopathy. -Discussed with him and elected to proceed with cervical spine MRI scan.  If it is totally normal and then we will try a radial tunnel injection.     Procedures: No procedures performed  No notes on file     PMFS History: Patient Active Problem List   Diagnosis Date Noted  . Nephrolithiasis 09/26/2018  . Left nephrolithiasis 09/05/2018  . Kidney stone 09/05/2018   Past Medical History:  Diagnosis Date  . Anxiety    no recent trouble with  . Anxiety   . History of kidney stones    passed 2 in age 70's or 43's  . Kidney stone   . UTI (lower urinary tract infection)      Family History  Problem Relation Age of Onset  . Diabetes Mother   . Neuropathy Mother   . Urolithiasis Father     Past Surgical History:  Procedure Laterality Date  . IR NEPHROSTOGRAM LEFT THRU EXISTING ACCESS  10/03/2018  . IR URETERAL STENT LEFT NEW ACCESS W/O SEP NEPHROSTOMY CATH  09/26/2018  . IR URETERAL STENT RIGHT NEW ACCESS W/SEP NEPHROSTOMY CATH  09/26/2018  . NEPHROLITHOTOMY Left 09/27/2018   Procedure: LEFT NEPHROLITHOTOMY PERCUTANEOUS;  Surgeon: Irine Seal, MD;  Location: [facility];  Service: Urology;  Laterality: Left;  . NO PAST SURGERIES    . WISDOM TOOTH EXTRACTION     Social History   Occupational History  . Not on file  Tobacco Use  . Smoking status: Current Every Day Smoker    Packs/day: 1.00    Years: 20.00    Pack years: 20.00    Types: Cigarettes  . Smokeless tobacco: Never Used  Substance and Sexual Activity  . Alcohol use: Yes  . Drug use: Yes    Types: Marijuana    Comment: Last used: 2 days ago   . Sexual activity: Not on file

## 2019-11-08 ENCOUNTER — Other Ambulatory Visit: Payer: Self-pay

## 2019-11-08 ENCOUNTER — Ambulatory Visit (HOSPITAL_COMMUNITY)
Admission: RE | Admit: 2019-11-08 | Discharge: 2019-11-08 | Disposition: A | Discharge status: Home / Self Care | Payer: Self-pay | Source: Ambulatory Visit | Attending: Family Medicine | Admitting: Family Medicine

## 2019-11-08 DIAGNOSIS — M542 Cervicalgia: Secondary | ICD-10-CM

## 2019-11-09 ENCOUNTER — Telehealth: Payer: Self-pay | Admitting: Family Medicine

## 2019-11-09 DIAGNOSIS — R200 Anesthesia of skin: Secondary | ICD-10-CM

## 2019-11-09 DIAGNOSIS — M542 Cervicalgia: Secondary | ICD-10-CM

## 2019-11-09 DIAGNOSIS — R29898 Other symptoms and signs involving the musculoskeletal system: Secondary | ICD-10-CM

## 2019-11-09 NOTE — Telephone Encounter (Signed)
MRI shows bone spurs and a disc bulge at the C5-6 level which causes narrowing of the nerve openings on both sides.  There is a similar finding at C6-7, but not as bad.  With the nerve study findings and the intensity of the pain, I think the neck MRI findings are the likely source of the pain.  I recommend referral to physical therapy.  Depending on pain intensity, could also consider referral back to Dr. Ernestina Patches for an epidural steroid injection if he wants.

## 2019-11-10 NOTE — Telephone Encounter (Signed)
I called and advised the patient of his results and the plan. He would like to proceed with PT (order already placed) and the ESI (no order yet).

## 2019-11-10 NOTE — Telephone Encounter (Signed)
Ordered

## 2019-11-10 NOTE — Addendum Note (Signed)
Addended by: Hortencia Pilar on: 11/10/2019 03:03 PM   Modules accepted: Orders

## 2019-12-07 ENCOUNTER — Encounter: Payer: Self-pay | Admitting: Physical Medicine and Rehabilitation

## 2019-12-07 ENCOUNTER — Ambulatory Visit (INDEPENDENT_AMBULATORY_CARE_PROVIDER_SITE_OTHER): Payer: Self-pay | Admitting: Physical Medicine and Rehabilitation

## 2019-12-07 ENCOUNTER — Other Ambulatory Visit: Payer: Self-pay

## 2019-12-07 ENCOUNTER — Ambulatory Visit: Payer: Self-pay

## 2019-12-07 VITALS — BP 146/95 | HR 83

## 2019-12-07 DIAGNOSIS — M5412 Radiculopathy, cervical region: Secondary | ICD-10-CM

## 2019-12-07 MED ORDER — METHYLPREDNISOLONE ACETATE 80 MG/ML IJ SUSP
80.0000 mg | Freq: Once | INTRAMUSCULAR | Status: AC
  Administered 2019-12-07: 80 mg

## 2019-12-07 NOTE — Progress Notes (Signed)
Jared Serrano - 43 y.o. male MRN [id number]  Date of birth: December 23, 1976  Office Visit Note: Visit Date: 12/07/2019 PCP: Patient, No Pcp Per Referred by: Eunice Blase, MD  Subjective: Chief Complaint  Patient presents with  . Neck - Pain  . Left Arm - Pain  . Left Hand - Pain   HPI:  Jared Serrano is a 43 y.o. male who comes in today For planned left C7-T1 interlaminar epidural steroid injection.  Patient followed by Dr. Eunice Blase.  I have seen the patient on one other occasion for electrodiagnostic study of the left upper limb.  Those notes can be reviewed.  Felt like he had some indication of may be a mild C7 radiculopathy.  MRI of the cervical spine was performed since have seen him and this does indicate some spondylitic change at C5-6 but a very small left-sided foraminal narrowing at C6-7 which could irritate the C7 nerve root.  We will complete epidural injection and see how he does.  He will continue to follow-up with Dr. Junius Roads.  Injection could be repeated depending on relief.  Intermittent injection over time can also be done if it is beneficial for extended periods.  ROS Otherwise per HPI.  Assessment & Plan: Visit Diagnoses:  1. Cervical radiculopathy     Plan: No additional findings.   Meds & Orders:  Meds ordered this encounter  Medications  . methylPREDNISolone acetate (DEPO-MEDROL) injection 80 mg    Orders Placed This Encounter  Procedures  . XR C-ARM NO REPORT  . Epidural Steroid injection    Follow-up: Return if symptoms worsen or fail to improve.   Procedures: No procedures performed  Cervical Epidural Steroid Injection - Interlaminar Approach with Fluoroscopic Guidance  Patient: Jared Serrano      Date of Birth: [birth year] MRN: [id number] PCP: Patient, No Pcp Per      Visit Date: 12/07/2019   Universal Protocol:    Date/Time: 12/10/209:41 AM  Consent Given By: the patient  Position: PRONE  Additional Comments: Vital signs were monitored before and  after the procedure. Patient was prepped and draped in the usual sterile fashion. The correct patient, procedure, and site was verified.   Injection Procedure Details:  Procedure Site One Meds Administered:  Meds ordered this encounter  Medications  . methylPREDNISolone acetate (DEPO-MEDROL) injection 80 mg     Laterality: Left  Location/Site: C7-T1  Needle size: 20 G  Needle type: Touhy  Needle Placement: Paramedian epidural space  Findings:  -Comments: Excellent flow of contrast into the epidural space.  Procedure Details: Using a paramedian approach from the side mentioned above, the region overlying the inferior lamina was localized under fluoroscopic visualization and the soft tissues overlying this structure were infiltrated with 4 ml. of 1% Lidocaine without Epinephrine. A # 20 gauge, Tuohy needle was inserted into the epidural space using a paramedian approach.  The epidural space was localized using loss of resistance along with lateral and contralateral oblique bi-planar fluoroscopic views.  After negative aspirate for air, blood, and CSF, a 2 ml. volume of Isovue-250 was injected into the epidural space and the flow of contrast was observed. Radiographs were obtained for documentation purposes.   The injectate was administered into the level noted above.  Additional Comments:  The patient tolerated the procedure well Dressing: 2 x 2 sterile gauze and Band-Aid    Post-procedure details: Patient was observed during the procedure. Post-procedure instructions were reviewed.  Patient left the clinic in stable condition.  Clinical History: MRI CERVICAL SPINE WITHOUT CONTRAST  TECHNIQUE: Multiplanar, multisequence MR imaging of the cervical spine was performed. No intravenous contrast was administered.  COMPARISON:  None.  FINDINGS: Alignment: There is slight reversal of the normal cervical lordosis.  Vertebrae: The vertebral body heights are well  maintained. No fracture, marrow edema,or pathologic marrow infiltration.  Cord: Normal signal and morphology.  Posterior Fossa, vertebral arteries, paraspinal tissues:  The visualized portion of the posterior fossa is unremarkable. Normal flow voids seen within the vertebral arteries. The paraspinal soft tissues are unremarkable.  Disc levels:  C1-C2: Atlanto-axial junction is normal, without canal narrowing  C2-C3: No significant spinal canal or neural foraminal narrowing  C3-C4: No significant spinal canal or neural foraminal narrowing  C4-C5: Minimal disc osteophyte complex which causes mild bilateral neural foraminal narrowing is seen.  C5-C6: There is a disc osteophyte complex and uncovertebral osteophytes which causes moderate bilateral neural foraminal narrowing, right greater than left. There is mild effacement anterior thecal sac.  C6-C7: There is a disc osteophyte complex which causes mild left neural foraminal narrowing.  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   Electronically Signed   By: Jonna Clark M.D.   On: 11/08/2019 21:47     Objective:  VS:  HT:    WT:   BMI:     BP:(!) 146/95  HR:83bpm  TEMP: ( )  RESP:  Physical Exam  Ortho Exam Imaging: XR C-ARM NO REPORT  Result Date: 12/07/2019 Please see Notes tab for imaging impression.

## 2019-12-07 NOTE — Procedures (Signed)
Cervical Epidural Steroid Injection - Interlaminar Approach with Fluoroscopic Guidance  Patient: Jared Serrano      Date of Birth: May 04, 1976 MRN: [id number] PCP: Patient, No Pcp Per      Visit Date: 12/07/2019   Universal Protocol:    Date/Time: 12/10/209:41 AM  Consent Given By: the patient  Position: PRONE  Additional Comments: Vital signs were monitored before and after the procedure. Patient was prepped and draped in the usual sterile fashion. The correct patient, procedure, and site was verified.   Injection Procedure Details:  Procedure Site One Meds Administered:  Meds ordered this encounter  Medications  . methylPREDNISolone acetate (DEPO-MEDROL) injection 80 mg     Laterality: Left  Location/Site: C7-T1  Needle size: 20 G  Needle type: Touhy  Needle Placement: Paramedian epidural space  Findings:  -Comments: Excellent flow of contrast into the epidural space.  Procedure Details: Using a paramedian approach from the side mentioned above, the region overlying the inferior lamina was localized under fluoroscopic visualization and the soft tissues overlying this structure were infiltrated with 4 ml. of 1% Lidocaine without Epinephrine. A # 20 gauge, Tuohy needle was inserted into the epidural space using a paramedian approach.  The epidural space was localized using loss of resistance along with lateral and contralateral oblique bi-planar fluoroscopic views.  After negative aspirate for air, blood, and CSF, a 2 ml. volume of Isovue-250 was injected into the epidural space and the flow of contrast was observed. Radiographs were obtained for documentation purposes.   The injectate was administered into the level noted above.  Additional Comments:  The patient tolerated the procedure well Dressing: 2 x 2 sterile gauze and Band-Aid    Post-procedure details: Patient was observed during the procedure. Post-procedure instructions were reviewed.  Patient left the  clinic in stable condition.

## 2019-12-07 NOTE — Progress Notes (Signed)
 .  Numeric Pain Rating Scale and Functional Assessment Average Pain 6   In the last MONTH (on 0-10 scale) has pain interfered with the following?  1. General activity like being  able to carry out your everyday physical activities such as walking, climbing stairs, carrying groceries, or moving a chair?  Rating(7)   +Driver, -BT, -Dye Allergies.  

## 2020-11-11 ENCOUNTER — Encounter (HOSPITAL_COMMUNITY): Payer: Self-pay | Admitting: Emergency Medicine

## 2020-11-11 ENCOUNTER — Emergency Department (HOSPITAL_COMMUNITY)
Admission: EM | Admit: 2020-11-11 | Discharge: 2020-11-11 | Disposition: A | Discharge status: Home / Self Care | Payer: BLUE CROSS/BLUE SHIELD | Attending: Emergency Medicine | Admitting: Emergency Medicine

## 2020-11-11 ENCOUNTER — Other Ambulatory Visit: Payer: Self-pay

## 2020-11-11 ENCOUNTER — Emergency Department (HOSPITAL_COMMUNITY): Payer: BLUE CROSS/BLUE SHIELD

## 2020-11-11 DIAGNOSIS — F17210 Nicotine dependence, cigarettes, uncomplicated: Secondary | ICD-10-CM | POA: Diagnosis not present

## 2020-11-11 DIAGNOSIS — N201 Calculus of ureter: Secondary | ICD-10-CM | POA: Insufficient documentation

## 2020-11-11 DIAGNOSIS — Z87442 Personal history of urinary calculi: Secondary | ICD-10-CM | POA: Insufficient documentation

## 2020-11-11 DIAGNOSIS — R319 Hematuria, unspecified: Secondary | ICD-10-CM

## 2020-11-11 DIAGNOSIS — R109 Unspecified abdominal pain: Secondary | ICD-10-CM | POA: Diagnosis present

## 2020-11-11 LAB — URINALYSIS, ROUTINE W REFLEX MICROSCOPIC
Bacteria, UA: NONE SEEN
Bilirubin Urine: NEGATIVE
Glucose, UA: NEGATIVE mg/dL
Ketones, ur: 5 mg/dL — AB
Leukocytes,Ua: NEGATIVE
Nitrite: NEGATIVE
Protein, ur: 30 mg/dL — AB
RBC / HPF: >50 RBC/hpf — ABNORMAL HIGH (ref 0–5)
Specific Gravity, Urine: 1.029 (ref 1.005–1.030)
pH: 5 (ref 5.0–8.0)

## 2020-11-11 LAB — CBC WITH DIFFERENTIAL/PLATELET
Abs Immature Granulocytes: 0.06 10*3/uL (ref 0.00–0.07)
Basophils Absolute: 0.1 10*3/uL (ref 0.0–0.1)
Basophils Relative: 1 %
Eosinophils Absolute: 0.4 10*3/uL (ref 0.0–0.5)
Eosinophils Relative: 3 %
HCT: 47.4 % (ref 39.0–52.0)
Hemoglobin: 15.9 g/dL (ref 13.0–17.0)
Immature Granulocytes: 0 %
Lymphocytes Relative: 20 %
Lymphs Abs: 2.7 10*3/uL (ref 0.7–4.0)
MCH: 32 pg (ref 26.0–34.0)
MCHC: 33.5 g/dL (ref 30.0–36.0)
MCV: 95.4 fL (ref 80.0–100.0)
Monocytes Absolute: 0.9 10*3/uL (ref 0.1–1.0)
Monocytes Relative: 6 %
Neutro Abs: 9.5 10*3/uL — ABNORMAL HIGH (ref 1.7–7.7)
Neutrophils Relative %: 70 %
Platelets: 316 10*3/uL (ref 150–400)
RBC: 4.97 MIL/uL (ref 4.22–5.81)
RDW: 14 % (ref 11.5–15.5)
WBC: 13.6 10*3/uL — ABNORMAL HIGH (ref 4.0–10.5)
nRBC: 0 % (ref 0.0–0.2)

## 2020-11-11 LAB — BASIC METABOLIC PANEL
Anion gap: 12 (ref 5–15)
BUN: 23 mg/dL — ABNORMAL HIGH (ref 6–20)
CO2: 23 mmol/L (ref 22–32)
Calcium: 9 mg/dL (ref 8.9–10.3)
Chloride: 104 mmol/L (ref 98–111)
Creatinine, Ser: 1.15 mg/dL (ref 0.61–1.24)
GFR, Estimated: >60 mL/min (ref >60)
Glucose, Bld: 156 mg/dL — ABNORMAL HIGH (ref 70–99)
Potassium: 3.3 mmol/L — ABNORMAL LOW (ref 3.5–5.1)
Sodium: 139 mmol/L (ref 135–145)

## 2020-11-11 MED ORDER — KETOROLAC TROMETHAMINE 30 MG/ML IJ SOLN
30.0000 mg | Freq: Once | INTRAMUSCULAR | Status: AC
  Administered 2020-11-11: 30 mg via INTRAVENOUS
  Filled 2020-11-11: qty 1

## 2020-11-11 MED ORDER — OXYCODONE-ACETAMINOPHEN 5-325 MG PO TABS: 1.0000 | ORAL_TABLET | ORAL | 0 refills | Status: AC | PRN

## 2020-11-11 MED ORDER — HYDROMORPHONE HCL 1 MG/ML IJ SOLN
1.0000 mg | Freq: Once | INTRAMUSCULAR | Status: AC
  Administered 2020-11-11: 1 mg via INTRAVENOUS
  Filled 2020-11-11: qty 1

## 2020-11-11 MED ORDER — TAMSULOSIN HCL 0.4 MG PO CAPS: 0.4000 mg | ORAL_CAPSULE | Freq: Every day | ORAL | 0 refills | Status: AC

## 2020-11-11 MED ORDER — ONDANSETRON 4 MG PO TBDP: 4.0000 mg | ORAL_TABLET | Freq: Three times a day (TID) | ORAL | 0 refills | Status: AC | PRN

## 2020-11-11 MED ORDER — ONDANSETRON HCL 4 MG/2ML IJ SOLN
4.0000 mg | Freq: Once | INTRAMUSCULAR | Status: AC
  Administered 2020-11-11: 4 mg via INTRAVENOUS
  Filled 2020-11-11: qty 2

## 2020-11-11 NOTE — ED Triage Notes (Signed)
Pt reports R flank pain x4 hours. States that he had lithotripsy in the past. Hx of kidney stone. Denies vomiting or fever.

## 2020-11-11 NOTE — Discharge Instructions (Signed)
Stone should pass in the next day or two.  Strain urine to monitor for this. Take the prescribed medication as directed. Follow-up with urology. Return to the ED for new or worsening symptoms-- high fever, uncontrolled pain or vomiting, inability to void, etc.

## 2020-11-11 NOTE — ED Provider Notes (Signed)
[facility]-EMERGENCY DEPT Provider Note   CSN: [id number] Arrival date & time: 11/11/20  0411     History Chief Complaint  Patient presents with  . Flank Pain    Jared Serrano is a 44 y.o. male.  The history is provided by the patient and medical records.   44 y.o. M with hx of anxiety, kidney stones with prior lithotripsy and stenting, presenting to the ED for right flank pain x4 hours.  No vomiting, fever, chills, sweats.  No difficulty urinating.  No meds PTA.  See's urology in [place].  Past Medical History:  Diagnosis Date  . Anxiety    no recent trouble with  . Anxiety   . History of kidney stones    passed 2 in age 4's or 75's  . Kidney stone   . UTI (lower urinary tract infection)     Patient Active Problem List   Diagnosis Date Noted  . Nephrolithiasis 09/26/2018  . Left nephrolithiasis 09/05/2018  . Kidney stone 09/05/2018    Past Surgical History:  Procedure Laterality Date  . IR NEPHROSTOGRAM LEFT THRU EXISTING ACCESS  10/03/2018  . IR URETERAL STENT LEFT NEW ACCESS W/O SEP NEPHROSTOMY CATH  09/26/2018  . IR URETERAL STENT RIGHT NEW ACCESS W/SEP NEPHROSTOMY CATH  09/26/2018  . NEPHROLITHOTOMY Left 09/27/2018   Procedure: LEFT NEPHROLITHOTOMY PERCUTANEOUS;  Surgeon: [name], MD;  Location: [facility] ORS;  Service: Urology;  Laterality: Left;  . NO PAST SURGERIES    . WISDOM TOOTH EXTRACTION         Family History  Problem Relation Age of Onset  . Diabetes Mother   . Neuropathy Mother   . Urolithiasis Father     Social History   Tobacco Use  . Smoking status: Current Every Day Smoker    Packs/day: 1.00    Years: 20.00    Pack years: 20.00    Types: Cigarettes  . Smokeless tobacco: Never Used  Vaping Use  . Vaping Use: Never used  Substance Use Topics  . Alcohol use: Yes  . Drug use: Yes    Types: Marijuana    Comment: Last used: 2 days ago     Home Medications Prior to Admission medications   Medication Sig  Start Date End Date Taking? Authorizing Provider  acetaminophen (TYLENOL) 325 MG tablet Take 650 mg by mouth every 6 (six) hours as needed for moderate pain.    [provider]  gabapentin (NEURONTIN) 100 MG capsule 1-3 PO q HS prn 09/18/19   Hilts, Michael, MD    Allergies    Patient has no known allergies.  Review of Systems   Review of Systems  Genitourinary: Positive for flank pain.  All other systems reviewed and are negative.   Physical Exam Updated Vital Signs BP (!) 149/93   Pulse 68   Temp 97.6 F (36.4 C) (Oral)   Resp 15   Ht 5\' 10"  (1.778 m)   Wt 72.6 kg   SpO2 94%   BMI 22.96 kg/m   Physical Exam Vitals and nursing note reviewed.  Constitutional:      Appearance: He is well-developed.     Comments: Appears uncomfortable, hunched over towards right side  HENT:     Head: Normocephalic and atraumatic.  Eyes:     Conjunctiva/sclera: Conjunctivae normal.     Pupils: Pupils are equal, round, and reactive to light.  Cardiovascular:     Rate and Rhythm: Normal rate and regular rhythm.  Heart sounds: Normal heart sounds.  Pulmonary:     Effort: Pulmonary effort is normal.     Breath sounds: Normal breath sounds.  Abdominal:     General: Bowel sounds are normal.     Palpations: Abdomen is soft.  Musculoskeletal:        General: Normal range of motion.     Cervical back: Normal range of motion.  Skin:    General: Skin is warm and dry.  Neurological:     Mental Status: He is alert and oriented to person, place, and time.     ED Results / Procedures / Treatments   Labs (all labs ordered are listed, but only abnormal results are displayed) Labs Reviewed  CBC WITH DIFFERENTIAL/PLATELET - Abnormal; Notable for the following components:      Result Value   WBC 13.6 (*)    Neutro Abs 9.5 (*)    All other components within normal limits  BASIC METABOLIC PANEL - Abnormal; Notable for the following components:   Potassium 3.3 (*)    Glucose, Bld  156 (*)    BUN 23 (*)    All other components within normal limits  URINALYSIS, ROUTINE W REFLEX MICROSCOPIC - Abnormal; Notable for the following components:   APPearance HAZY (*)    Hgb urine dipstick LARGE (*)    Ketones, ur 5 (*)    Protein, ur 30 (*)    RBC / HPF >50 (*)    All other components within normal limits    EKG None  Radiology CT Renal Stone Study  Result Date: 11/11/2020 CLINICAL DATA:  Flank pain with kidney stone suspected. EXAM: CT ABDOMEN AND PELVIS WITHOUT CONTRAST TECHNIQUE: Multidetector CT imaging of the abdomen and pelvis was performed following the standard protocol without IV contrast. COMPARISON:  09/05/2018 FINDINGS: Lower chest:  No contributory findings. Hepatobiliary: No focal liver abnormality.No evidence of biliary obstruction or stone. Pancreas: Unremarkable. Spleen: Unremarkable. Adrenals/Urinary Tract: Negative adrenals. Mild right hydroureteronephrosis with perinephric stranding due to a 3 mm UVJ calculus. No additional right urolithiasis. Punctate left upper pole calculus. Subtle cystic density in the interpolar left kidney best seen on coronal reformats. Negative bladder which is decompressed. Stomach/Bowel:  No obstruction. No appendicitis. Vascular/Lymphatic: No acute vascular abnormality. No mass or adenopathy. Reproductive:No pathologic findings. Other: No ascites or pneumoperitoneum. Musculoskeletal: No acute abnormalities. IMPRESSION: 1. Obstructing 3 mm right UVJ calculus. 2. Punctate left renal calculus. Electronically Signed   By: Marnee Spring M.D.   On: 11/11/2020 05:11    Procedures Procedures (including critical care time)  Medications Ordered in ED Medications  HYDROmorphone (DILAUDID) injection 1 mg (1 mg Intravenous Given 11/11/20 0441)  ondansetron (ZOFRAN) injection 4 mg (4 mg Intravenous Given 11/11/20 0441)  ketorolac (TORADOL) 30 MG/ML injection 30 mg (30 mg Intravenous Given 11/11/20 0541)  HYDROmorphone (DILAUDID)  injection 1 mg (1 mg Intravenous Given 11/11/20 0542)    ED Course  I have reviewed the triage vital signs and the nursing notes.  Pertinent labs & imaging results that were available during my care of the patient were reviewed by me and considered in my medical decision making (see chart for details).    MDM Rules/Calculators/A&P  44 year old male presenting to the ED with right flank pain.  History of kidney stones and states this feels similar.  He is uncomfortable appearing but nontoxic.  Vitals are stable.  Labs grossly reassuring.  UA with blood but no signs of infection.  CT renal study with 3 mm  calculus at the right UVJ.  Patient has had significant relief with IV Dilaudid and Toradol.  He is tolerating p.o. without difficulty.  Feel he stable for discharge home with continued symptomatic care.  Close follow-up with urology.  Return here for any new or acute changes.  Final Clinical Impression(s) / ED Diagnoses Final diagnoses:  Right ureteral calculus  Hematuria, unspecified type    Rx / DC Orders ED Discharge Orders         Ordered    oxyCODONE-acetaminophen (PERCOCET) 5-325 MG tablet  Every 4 hours PRN        11/11/20 0617    ondansetron (ZOFRAN ODT) 4 MG disintegrating tablet  Every 8 hours PRN        11/11/20 0617    tamsulosin (FLOMAX) 0.4 MG CAPS capsule  Daily after supper        11/11/20 0617           Garlon Hatchet, PA-C 11/11/20 0621    Molpus, Jonny Ruiz, MD 11/11/20 [phone]

## 2021-10-16 IMAGING — CT CT RENAL STONE PROTOCOL
2 of 4 series · 16 of 46 positions shown, 18 images · non-contrast
Comparison: 09/05/2018

CLINICAL DATA: Flank pain with kidney stone suspected.

EXAM:
CT ABDOMEN AND PELVIS WITHOUT CONTRAST
TECHNIQUE: Multidetector CT imaging of the abdomen and pelvis was performed
following the standard protocol without IV contrast.

[Series 2: axial st · axial · 0.75mm/px · z∈[-522,-72]mm · 13 of 102 slices shown, 15 images]
[im 6/102  soft-tissue]
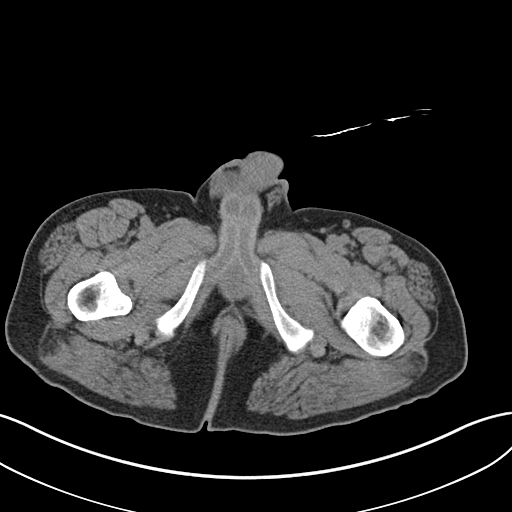
[im 6/102  bone]
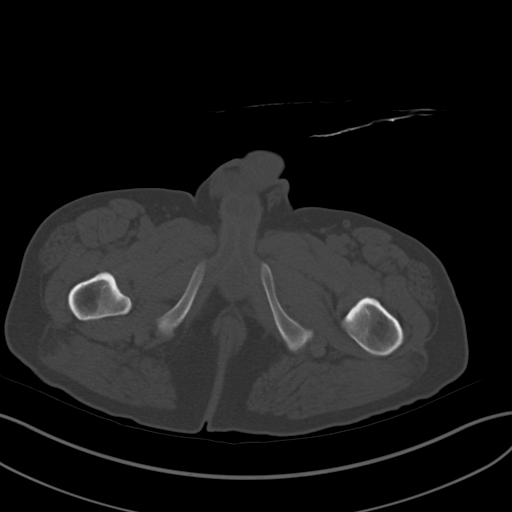
[im 12/102  soft-tissue]
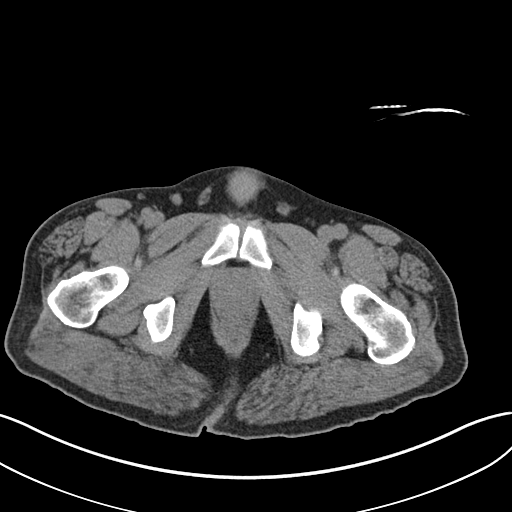
[im 24/102  soft-tissue]
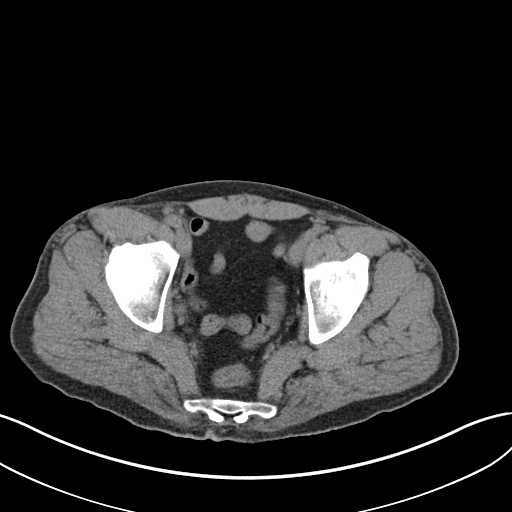
[im 30/102  soft-tissue]
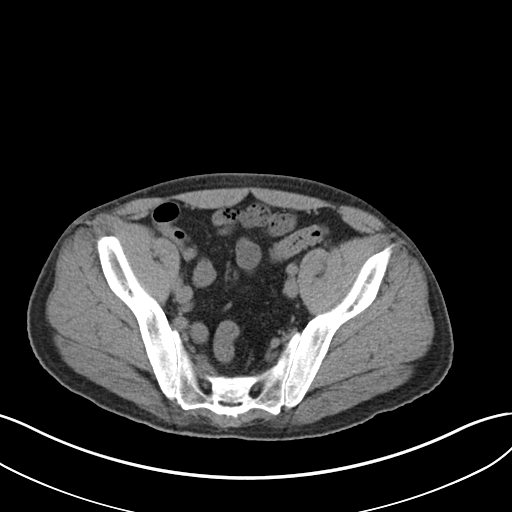
[im 36/102  soft-tissue]
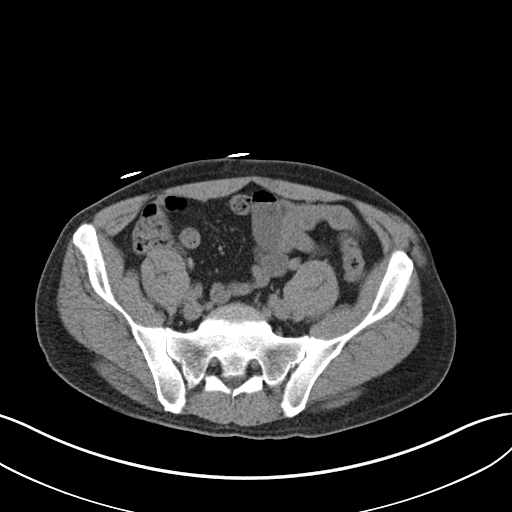
[im 42/102  soft-tissue]
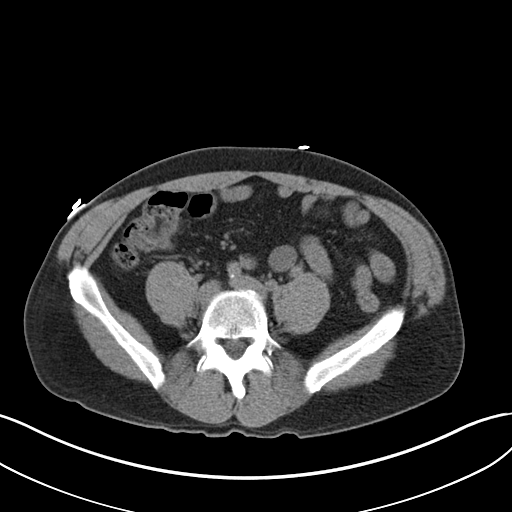
[im 54/102  soft-tissue]
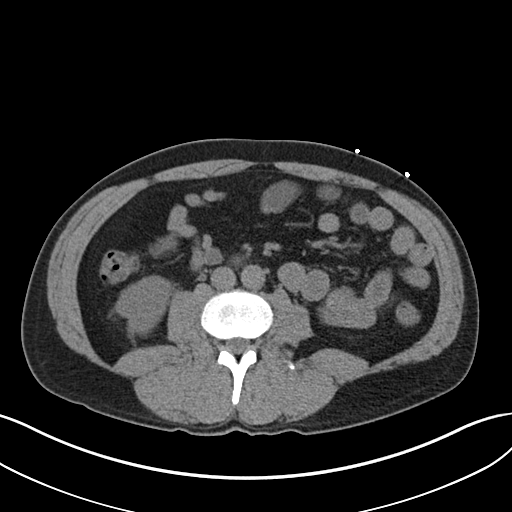
[im 60/102  soft-tissue]
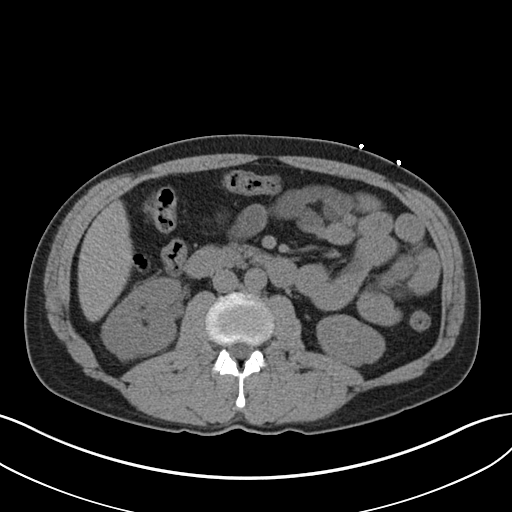
[im 66/102  soft-tissue]
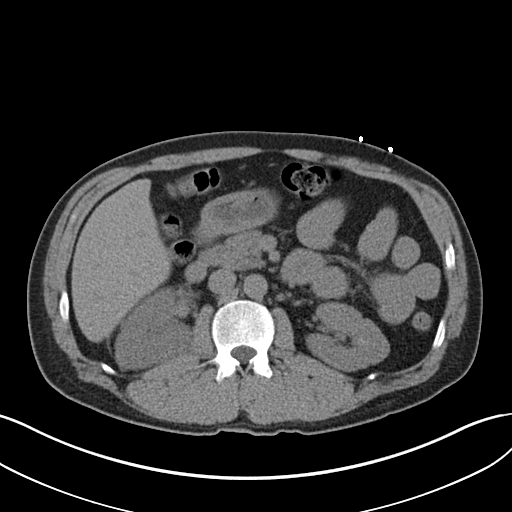
[im 66/102  bone]
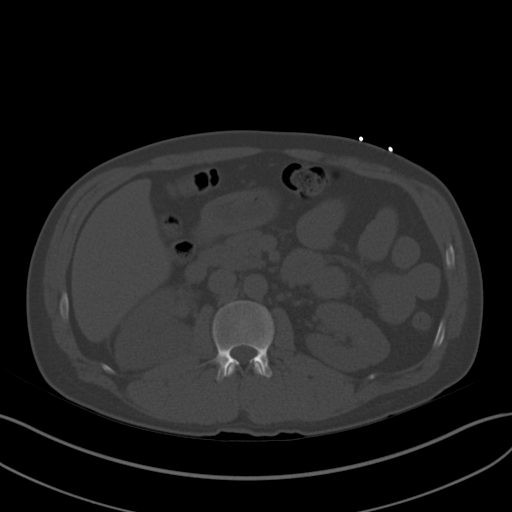
[im 72/102  soft-tissue]
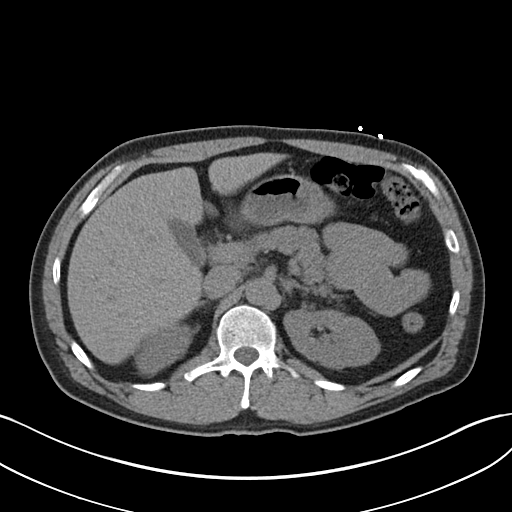
[im 78/102  soft-tissue]
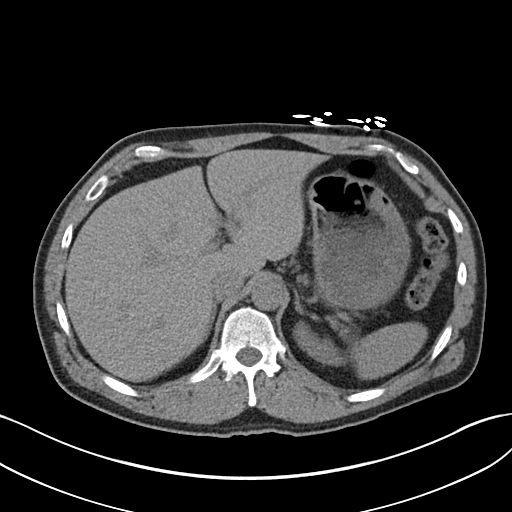
[im 90/102  soft-tissue]
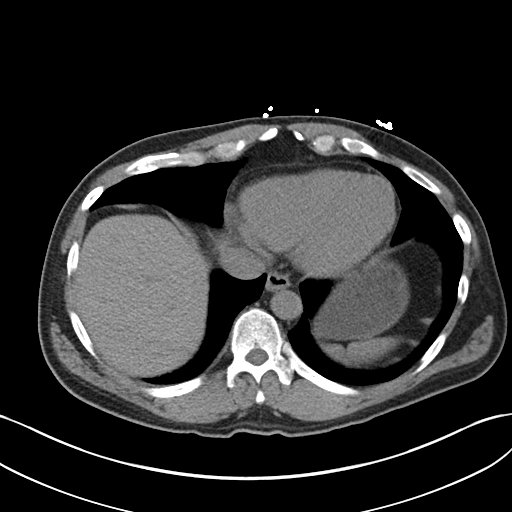
[im 96/102  soft-tissue]
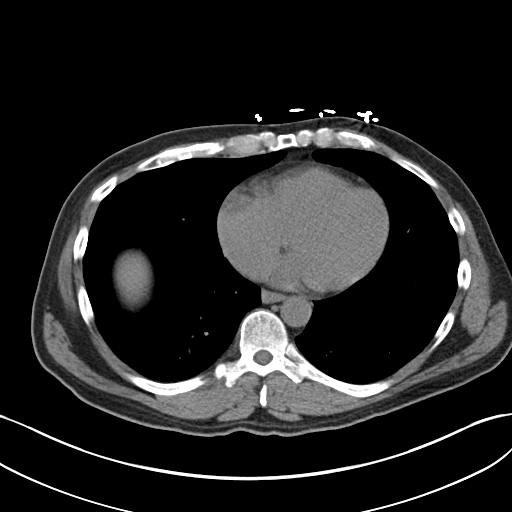

[Series 5: coronal · coronal · 0.77mm/px · 3 of 129 slices shown]
[im 43/129  soft-tissue]
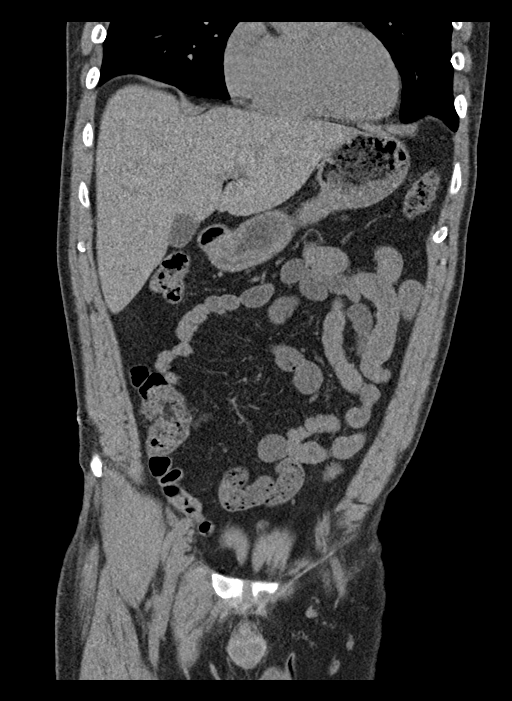
[im 57/129  soft-tissue]
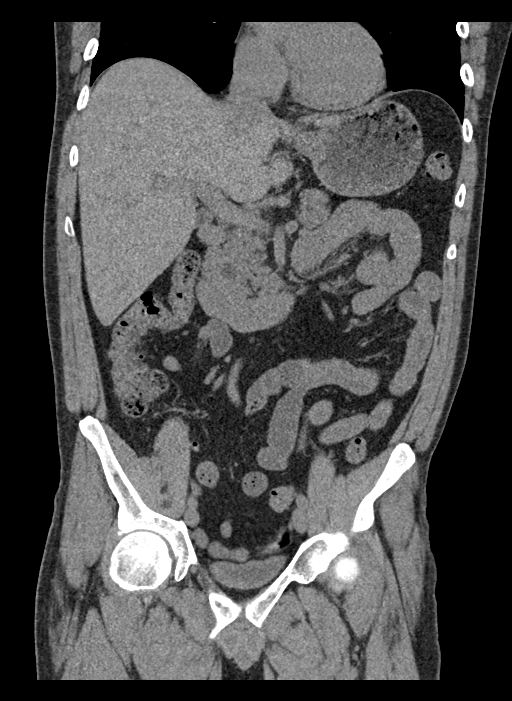
[im 72/129  soft-tissue]
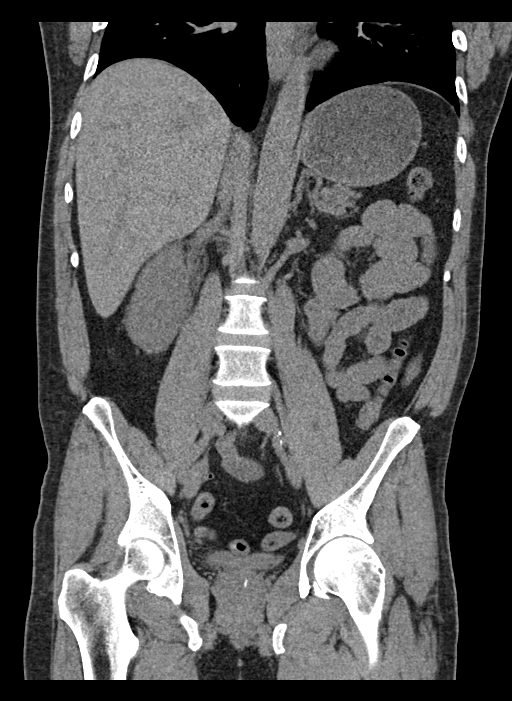

[16 of 46 positions shown; findings below may reference images not displayed]

FINDINGS: Lower chest:  No contributory findings.

Hepatobiliary: No focal liver abnormality.No evidence of biliary
obstruction or stone.

Pancreas: Unremarkable.

Spleen: Unremarkable.

Adrenals/Urinary Tract: Negative adrenals. Mild right
hydroureteronephrosis with perinephric stranding due to a 3 mm UVJ
calculus. No additional right urolithiasis. Punctate left upper pole
calculus. Subtle cystic density in the interpolar left kidney best
seen on coronal reformats. Negative bladder which is decompressed.

Stomach/Bowel:  No obstruction. No appendicitis.

Vascular/Lymphatic: No acute vascular abnormality. No mass or
adenopathy.

Reproductive:No pathologic findings.

Other: No ascites or pneumoperitoneum.

Musculoskeletal: No acute abnormalities.
IMPRESSION: 1. Obstructing 3 mm right UVJ calculus.
2. Punctate left renal calculus.
